# Patient Record
Sex: Female | Born: 1955 | Race: White | Hispanic: No | Marital: Married | Smoking: Never smoker
Health system: Southern US, Community
[De-identification: ages and names within clinical notes are randomized; demographics above are authoritative.]

## PROBLEM LIST (undated history)

## (undated) DIAGNOSIS — T7840XA Allergy, unspecified, initial encounter: Secondary | ICD-10-CM

## (undated) DIAGNOSIS — F32A Depression, unspecified: Secondary | ICD-10-CM

## (undated) DIAGNOSIS — F329 Major depressive disorder, single episode, unspecified: Secondary | ICD-10-CM

## (undated) DIAGNOSIS — E785 Hyperlipidemia, unspecified: Secondary | ICD-10-CM

## (undated) HISTORY — DX: Allergy, unspecified, initial encounter: T78.40XA

## (undated) HISTORY — DX: Hyperlipidemia, unspecified: E78.5

## (undated) HISTORY — DX: Depression, unspecified: F32.A

## (undated) HISTORY — PX: TOTAL ABDOMINAL HYSTERECTOMY: SHX209

## (undated) HISTORY — DX: Major depressive disorder, single episode, unspecified: F32.9

---

## 2004-03-09 DIAGNOSIS — M949 Disorder of cartilage, unspecified: Secondary | ICD-10-CM

## 2004-03-09 DIAGNOSIS — M899 Disorder of bone, unspecified: Secondary | ICD-10-CM | POA: Insufficient documentation

## 2006-11-19 ENCOUNTER — Encounter: Payer: Self-pay | Admitting: Family Medicine

## 2007-03-22 ENCOUNTER — Ambulatory Visit: Payer: Self-pay | Admitting: Family Medicine

## 2007-03-22 DIAGNOSIS — F339 Major depressive disorder, recurrent, unspecified: Secondary | ICD-10-CM

## 2007-03-22 DIAGNOSIS — G43709 Chronic migraine without aura, not intractable, without status migrainosus: Secondary | ICD-10-CM | POA: Insufficient documentation

## 2007-03-22 DIAGNOSIS — E785 Hyperlipidemia, unspecified: Secondary | ICD-10-CM

## 2007-03-28 ENCOUNTER — Encounter: Admission: RE | Admit: 2007-03-28 | Discharge: 2007-03-28 | Payer: Self-pay | Admitting: Family Medicine

## 2007-04-23 ENCOUNTER — Encounter: Payer: Self-pay | Admitting: Family Medicine

## 2007-04-23 DIAGNOSIS — D72819 Decreased white blood cell count, unspecified: Secondary | ICD-10-CM | POA: Insufficient documentation

## 2007-04-25 ENCOUNTER — Encounter: Payer: Self-pay | Admitting: Family Medicine

## 2007-05-24 ENCOUNTER — Encounter: Payer: Self-pay | Admitting: Family Medicine

## 2007-05-28 ENCOUNTER — Encounter: Admission: RE | Admit: 2007-05-28 | Discharge: 2007-05-28 | Payer: Self-pay | Admitting: Family Medicine

## 2007-05-28 ENCOUNTER — Ambulatory Visit: Payer: Self-pay | Admitting: Family Medicine

## 2007-05-28 DIAGNOSIS — M76899 Other specified enthesopathies of unspecified lower limb, excluding foot: Secondary | ICD-10-CM

## 2007-05-28 DIAGNOSIS — M5430 Sciatica, unspecified side: Secondary | ICD-10-CM | POA: Insufficient documentation

## 2007-05-28 LAB — CONVERTED CEMR LAB
LDL Cholesterol: 94 mg/dL (ref 0–99)
Triglycerides: 96 mg/dL (ref <150)
VLDL: 19 mg/dL (ref 0–40)

## 2008-01-27 ENCOUNTER — Telehealth: Payer: Self-pay | Admitting: Family Medicine

## 2008-06-02 ENCOUNTER — Telehealth: Payer: Self-pay | Admitting: Family Medicine

## 2008-06-05 ENCOUNTER — Ambulatory Visit: Payer: Self-pay | Admitting: Family Medicine

## 2008-06-05 DIAGNOSIS — M2550 Pain in unspecified joint: Secondary | ICD-10-CM | POA: Insufficient documentation

## 2008-06-05 DIAGNOSIS — J309 Allergic rhinitis, unspecified: Secondary | ICD-10-CM | POA: Insufficient documentation

## 2008-06-16 ENCOUNTER — Encounter: Admission: RE | Admit: 2008-06-16 | Discharge: 2008-06-16 | Payer: Self-pay | Admitting: Family Medicine

## 2008-06-17 ENCOUNTER — Encounter: Payer: Self-pay | Admitting: Family Medicine

## 2008-06-18 LAB — CONVERTED CEMR LAB
ALT: 18 units/L (ref 0–35)
Alkaline Phosphatase: 40 units/L (ref 39–117)
Anti Nuclear Antibody(ANA): NEGATIVE
Basophils Absolute: 0 10*3/uL (ref 0.0–0.1)
CO2: 20 meq/L (ref 19–32)
Hemoglobin: 14.7 g/dL (ref 12.0–15.0)
LDL Cholesterol: 120 mg/dL — ABNORMAL HIGH (ref 0–99)
Lymphocytes Relative: 44 % (ref 12–46)
Lymphs Abs: 1.9 10*3/uL (ref 0.7–4.0)
Monocytes Absolute: 0.4 10*3/uL (ref 0.1–1.0)
Neutro Abs: 1.9 10*3/uL (ref 1.7–7.7)
Potassium: 4.3 meq/L (ref 3.5–5.3)
RDW: 13.9 % (ref 11.5–15.5)
Sed Rate: 4 mm/hr (ref 0–22)
Sodium: 139 meq/L (ref 135–145)
Total Bilirubin: 0.5 mg/dL (ref 0.3–1.2)
Total Protein: 7 g/dL (ref 6.0–8.3)
VLDL: 26 mg/dL (ref 0–40)
WBC: 4.2 10*3/uL (ref 4.0–10.5)

## 2008-06-24 ENCOUNTER — Encounter: Payer: Self-pay | Admitting: Family Medicine

## 2009-02-15 ENCOUNTER — Telehealth: Payer: Self-pay | Admitting: Family Medicine

## 2009-06-10 ENCOUNTER — Telehealth: Payer: Self-pay | Admitting: Family Medicine

## 2009-07-06 ENCOUNTER — Telehealth: Payer: Self-pay | Admitting: Family Medicine

## 2009-07-16 ENCOUNTER — Ambulatory Visit: Payer: Self-pay | Admitting: Family Medicine

## 2009-08-13 ENCOUNTER — Ambulatory Visit: Payer: Self-pay | Admitting: Family Medicine

## 2009-08-16 LAB — CONVERTED CEMR LAB
ALT: 24 units/L (ref 0–35)
AST: 23 units/L (ref 0–37)
Alkaline Phosphatase: 46 units/L (ref 39–117)
LDL Cholesterol: 163 mg/dL — ABNORMAL HIGH (ref 0–99)
Sodium: 140 meq/L (ref 135–145)
Total Bilirubin: 0.7 mg/dL (ref 0.3–1.2)
Total Protein: 7.4 g/dL (ref 6.0–8.3)
Triglycerides: 137 mg/dL (ref <150)
VLDL: 27 mg/dL (ref 0–40)

## 2010-02-01 NOTE — Progress Notes (Signed)
Summary: Change Pravastatin dose/ no insurance/cheaper  Phone Note Call from Patient Call back at Home Phone [phone]   Summary of Call: No insurance so can not use [facility] anymore. Needs cholesterol med sent to [facility] in [place]. On Pravastatin 80mg  but it will be cheaper for her if she gets the Pravastatin 40mg  and take 2 a day Initial call taken by: Kathlene November,  February 15, 2009 1:24 PM    New/Updated Medications: PRAVASTATIN SODIUM 40 MG TABS (PRAVASTATIN SODIUM) 2 tabs by mouth qhs Prescriptions: PRAVASTATIN SODIUM 40 MG TABS (PRAVASTATIN SODIUM) 2 tabs by mouth qhs  #60 x 3   Entered and Authorized by:   Seymour Bars DO   Signed by:   Seymour Bars DO on 02/15/2009   Method used:   Electronically to        [facility] [phone]* (retail)       [address]       [place], [place]  [address]       Ph: [phone]       Fax: [phone]   RxID:   [id number]

## 2010-02-01 NOTE — Progress Notes (Signed)
Summary: refill pravastatin  Phone Note Refill Request Message from:  Fax from Pharmacy on July 06, 2009 4:17 PM  Refills Requested: Medication #1:  PRAVASTATIN SODIUM 40 MG TABS 2 tabs by mouth qhs   Last Refilled: [facility]10/2009 [facility] [phone]   Method Requested: Fax to Local Pharmacy Initial call taken by: Duard Brady LPN,  July 07, 863 4:17 PM  Follow-up for Phone Call        [facility] - [facility] - needs to be seen Follow-up by: Duard Brady LPN,  July 06, 7844 5:14 PM

## 2010-02-01 NOTE — Progress Notes (Signed)
Summary: No insurance needs med  Phone Note Call from Patient Call back at [facility] Phone [phone]   Caller: Patient Call For: Demetris Capell Summary of Call: Pt has no insurance and needs refills on the cholesterol med. [facility] [place]  Follow-up for Phone Call        She is due for labs. I will only fill her #1 month. Follow-up by: Seymour Bars DO,  June 10, 2009 10:28 AM    Prescriptions: PRAVASTATIN SODIUM 40 MG TABS (PRAVASTATIN SODIUM) 2 tabs by mouth qhs  #60 x 0   Entered and Authorized by:   Seymour Bars DO   Signed by:   Seymour Bars DO on 06/10/2009   Method used:   Electronically to        [facility] [phone]* (retail)       [address]       [place], [place]  [address]       Ph: [phone]       Fax: [phone]   RxID:   [id number]   Appended Document: No insurance needs med Pt notified of above instructions and med sent to pharmacy. KJ LPN

## 2010-02-01 NOTE — Assessment & Plan Note (Signed)
Summary: no charge; statin RFd for only 1 month   Vital Signs:  Patient profile:   55 year old female Height:      65.5 inches Weight:      174 pounds BMI:     28.62 O2 Sat:      99 % on Room air Pulse rate:   58 / minute BP sitting:   141 / 90  (left arm) Cuff size:   regular  Vitals Entered By: Payton Spark CMA (July 16, 2009 12:59 PM)  O2 Flow:  Room air CC: F/U. ? labs for med refills.   Primary Care Provider:  Nani Gasser MD  CC:  F/U. ? labs for med refills..  History of Present Illness: Pt was seen briefly today but refused to have labs done since she is self pay.  She is out of her Pravastatin and her Alendronate.    Current Medications (verified): 1)  Pravastatin Sodium 40 Mg Tabs (Pravastatin Sodium) .... 2 Tabs By Mouth Qhs 2)  Alendronate Sodium 70 Mg  Tabs (Alendronate Sodium) .... Once A Week By Mouth 3)  Glucosamine 1500 Complex   Caps (Glucosamine-Chondroit-Vit C-Mn) .... Take One- Two Tablets By Mouth Daily 4)  Calcium/vitamin D/minerals 600-200 Mg-Unit  Tabs (Calcium Carbonate-Vit D-Min) .... Take One Tabnlet By Mouth Once Adday 5)  Fish Oil 1200 Mg  Caps (Omega-3 Fatty Acids) .... Take One Tablet By Mouth Once A Day 6)  Potassium 95 Mg  Tbcr (Potassium Gluconate) .... Take One Tablet By Mouth Once Aday 7)  Magnesium Oxide 400 Mg  Tabs (Magnesium Oxide) .... Take 1 Tablet By Mouth Once A Day 8)  Fluoxetine Hcl 10 Mg  Tabs (Fluoxetine Hcl) .... Take 1 Tablet By Mouth Once A Day 9)  Fexofenadine Hcl 180 Mg Tabs (Fexofenadine Hcl) .... Take 1 Tablet By Mouth Once A Day 10)  Piroxicam 10 Mg Caps (Piroxicam) .... Take 1 Tablet By Mouth Two Times A Day As Needed  Allergies (verified): 1)  ! Lipitor 2)  Amitriptyline Hcl (Amitriptyline Hcl)   Impression & Recommendations:  Problem # 1:  HYPERLIPIDEMIA (ICD-272.4)  I RFd her meds for 1 month month only.  I will not charge her today as I have given her the # for the [facility] since she  reflused to pay for her labs which are overdue.   Her updated medication list for this problem includes:    Pravastatin Sodium 40 Mg Tabs (Pravastatin sodium) .Marland Kitchen... 2 tabs by mouth qhs  Orders: No Charge Patient Arrived (NCPA0) (NCPA0)  Complete Medication List: 1)  Pravastatin Sodium 40 Mg Tabs (Pravastatin sodium) .... 2 tabs by mouth qhs 2)  Alendronate Sodium 70 Mg Tabs (Alendronate sodium) .... Once a week by mouth 3)  Glucosamine 1500 Complex Caps (Glucosamine-chondroit-vit c-mn) .... Take one- two tablets by mouth daily 4)  Calcium/vitamin D/minerals 600-200 Mg-unit Tabs (Calcium carbonate-vit d-min) .... Take one tabnlet by mouth once adday 5)  Fish Oil 1200 Mg Caps (Omega-3 fatty acids) .... Take one tablet by mouth once a day 6)  Potassium 95 Mg Tbcr (Potassium gluconate) .... Take one tablet by mouth once aday 7)  Magnesium Oxide 400 Mg Tabs (Magnesium oxide) .... Take 1 tablet by mouth once a day 8)  Fluoxetine Hcl 10 Mg Tabs (Fluoxetine hcl) .... Take 1 tablet by mouth once a day 9)  Fexofenadine Hcl 180 Mg Tabs (Fexofenadine hcl) .... Take 1 tablet by mouth once a day 10)  Piroxicam 10 Mg  Caps (Piroxicam) .... Take 1 tablet by mouth two times a day as needed Prescriptions: ALENDRONATE SODIUM 70 MG  TABS (ALENDRONATE SODIUM) once a week by mouth  #12 x 0   Entered and Authorized by:   Seymour Bars DO   Signed by:   Seymour Bars DO on 07/16/2009   Method used:   Electronically to        [facility] [phone]* (retail)       [address]       [place], [place]  [address]       Ph: [phone]       Fax: [phone]   RxID:   [id number] PRAVASTATIN SODIUM 40 MG TABS (PRAVASTATIN SODIUM) 2 tabs by mouth qhs  #60 x 0   Entered and Authorized by:   Seymour Bars DO   Signed by:   Seymour Bars DO on 07/16/2009   Method used:   Electronically to        [facility] [phone]* (retail)       [address]       [place], [place]  [address]       Ph: [phone]        Fax: [phone]   RxID:   [id number]

## 2010-02-01 NOTE — Assessment & Plan Note (Signed)
Summary: f/u on meds   Vital Signs:  Patient profile:   55 year old female Height:      65.5 inches Weight:      175 pounds Pulse rate:   75 / minute BP sitting:   138 / 85  (left arm) Cuff size:   regular  Vitals Entered By: Avon Gully CMA, Duncan Dull) (August 13, 2009 8:43 AM) CC: f/u chol, needs labs   Primary Care Khamille Beynon:  Nani Gasser MD  CC:  f/u chol and needs labs.  History of Present Illness: f/u chol, needs labs.  No myalgias or SE of the medication.  Needs refills.  Has been working on losing weight.    Current Medications (verified): 1)  Pravastatin Sodium 40 Mg Tabs (Pravastatin Sodium) .... 2 Tabs By Mouth Qhs 2)  Alendronate Sodium 70 Mg  Tabs (Alendronate Sodium) .... Once A Week By Mouth 3)  Glucosamine 1500 Complex   Caps (Glucosamine-Chondroit-Vit C-Mn) .... Take One- Two Tablets By Mouth Daily 4)  Calcium/vitamin D/minerals 600-200 Mg-Unit  Tabs (Calcium Carbonate-Vit D-Min) .... Take One Tabnlet By Mouth Once Adday 5)  Fish Oil 1200 Mg  Caps (Omega-3 Fatty Acids) .... Take One Tablet By Mouth Once A Day 6)  Potassium 95 Mg  Tbcr (Potassium Gluconate) .... Take One Tablet By Mouth Once Aday 7)  Magnesium Oxide 400 Mg  Tabs (Magnesium Oxide) .... Take 1 Tablet By Mouth Once A Day 8)  Fluoxetine Hcl 10 Mg  Tabs (Fluoxetine Hcl) .... Take 1 Tablet By Mouth Once A Day 9)  Fexofenadine Hcl 180 Mg Tabs (Fexofenadine Hcl) .... Take 1 Tablet By Mouth Once A Day 10)  Piroxicam 10 Mg Caps (Piroxicam) .... Take 1 Tablet By Mouth Two Times A Day As Needed  Allergies (verified): 1)  ! Lipitor 2)  Amitriptyline Hcl (Amitriptyline Hcl)  Comments:  Nurse/Medical Assistant: The patient's medications and allergies were reviewed with the patient and were updated in the Medication and Allergy Lists. Avon Gully CMA, Duncan Dull) (August 13, 2009 8:43 AM)  Physical Exam  General:  Well-developed,well-nourished,in no acute distress; alert,appropriate and  cooperative throughout examination Head:  Normocephalic and atraumatic without obvious abnormalities. No apparent alopecia or balding. Neck:  No deformities, masses, or tenderness noted. Lungs:  Normal respiratory effort, chest expands symmetrically. Lungs are clear to auscultation, no crackles or wheezes. Heart:  Normal rate and regular rhythm. S1 and S2 normal without gallop, murmur, click, rub or other extra sounds. No carotid or abdominal bruits.  Abdomen:  Bowel sounds positive,abdomen soft and non-tender without masses, organomegaly or hernias noted. Skin:  no rashes.   Psych:  Cognition and judgment appear intact. Alert and cooperative with normal attention span and concentration. No apparent delusions, illusions, hallucinations   Impression & Recommendations:  Problem # 1:  HYPERLIPIDEMIA (ICD-272.4) Due to check lipids. Doing well.  Her updated medication list for this problem includes:    Pravastatin Sodium 40 Mg Tabs (Pravastatin sodium) .Marland Kitchen... 2 tabs by mouth qhs  Orders: T-Lipid Profile (24401-02725) T-Comprehensive Metabolic Panel ([id number])  Problem # 2:  DEPRESSION (ICD-311) Doing well on the medication. Happy with current dose. Let her know can incrase if needed,just to call.  Her updated medication list for this problem includes:    Fluoxetine Hcl 10 Mg Tabs (Fluoxetine hcl) .Marland Kitchen... Take 1 tablet by mouth once a day  Complete Medication List: 1)  Pravastatin Sodium 40 Mg Tabs (Pravastatin sodium) .... 2 tabs by mouth qhs 2)  Alendronate Sodium 70 Mg  Tabs (Alendronate sodium) .... Once a week by mouth 3)  Glucosamine 1500 Complex Caps (Glucosamine-chondroit-vit c-mn) .... Take one- two tablets by mouth daily 4)  Calcium/vitamin D/minerals 600-200 Mg-unit Tabs (Calcium carbonate-vit d-min) .... Take one tabnlet by mouth once adday 5)  Fish Oil 1200 Mg Caps (Omega-3 fatty acids) .... Take one tablet by mouth once a day 6)  Potassium 95 Mg Tbcr (Potassium gluconate) ....  Take one tablet by mouth once aday 7)  Magnesium Oxide 400 Mg Tabs (Magnesium oxide) .... Take 1 tablet by mouth once a day 8)  Fluoxetine Hcl 10 Mg Tabs (Fluoxetine hcl) .... Take 1 tablet by mouth once a day 9)  Fexofenadine Hcl 180 Mg Tabs (Fexofenadine hcl) .... Take 1 tablet by mouth once a day 10)  Piroxicam 10 Mg Caps (Piroxicam) .... Take 1 tablet by mouth two times a day as needed  Patient Instructions: 1)  We will call you with your lab results next week.  Prescriptions: PIROXICAM 10 MG CAPS (PIROXICAM) Take 1 tablet by mouth two times a day as needed  #60 x 4   Entered and Authorized by:   Nani Gasser MD   Signed by:   Nani Gasser MD on 08/13/2009   Method used:   Electronically to        [facility] [phone]* (retail)       [address]       [place], [place]  [address]       Ph: [phone]       Fax: [phone]   RxID:   [id number] FLUOXETINE HCL 10 MG  TABS (FLUOXETINE HCL) Take 1 tablet by mouth once a day  #90 x 4   Entered and Authorized by:   Nani Gasser MD   Signed by:   Nani Gasser MD on 08/13/2009   Method used:   Electronically to        [facility] [phone]* (retail)       [address]       [place], [place]  [address]       Ph: [phone]       Fax: [phone]   RxID:   [id number] ALENDRONATE SODIUM 70 MG  TABS (ALENDRONATE SODIUM) once a week by mouth  #12 x 3   Entered and Authorized by:   Nani Gasser MD   Signed by:   Nani Gasser MD on 08/13/2009   Method used:   Electronically to        [facility] [phone]* (retail)       [address]       [place], [place]  [address]       Ph: [phone]       Fax: [phone]   RxID:   [id number] PRAVASTATIN SODIUM 40 MG TABS (PRAVASTATIN SODIUM) 2 tabs by mouth qhs  #60 x 11   Entered and Authorized by:   Nani Gasser MD   Signed by:   Nani Gasser MD on 08/13/2009   Method used:   Electronically to        [facility] [phone]* (retail)       [address]       [place], [place]  [address]       Ph: [phone]       Fax: [phone]   RxID:   [id number]

## 2010-05-19 ENCOUNTER — Other Ambulatory Visit: Payer: Self-pay | Admitting: Family Medicine

## 2010-08-18 ENCOUNTER — Other Ambulatory Visit: Payer: Self-pay | Admitting: *Deleted

## 2010-08-24 ENCOUNTER — Other Ambulatory Visit: Payer: Self-pay | Admitting: *Deleted

## 2010-08-26 ENCOUNTER — Ambulatory Visit (INDEPENDENT_AMBULATORY_CARE_PROVIDER_SITE_OTHER): Payer: Self-pay | Admitting: Family Medicine

## 2010-08-26 VITALS — BP 134/88 | HR 75 | Ht 65.5 in | Wt 162.0 lb

## 2010-08-26 DIAGNOSIS — E785 Hyperlipidemia, unspecified: Secondary | ICD-10-CM

## 2010-08-26 DIAGNOSIS — F329 Major depressive disorder, single episode, unspecified: Secondary | ICD-10-CM

## 2010-08-26 DIAGNOSIS — G43709 Chronic migraine without aura, not intractable, without status migrainosus: Secondary | ICD-10-CM

## 2010-08-26 MED ORDER — PRAVASTATIN SODIUM 40 MG PO TABS: 40.0000 mg | ORAL_TABLET | Freq: Two times a day (BID) | ORAL | Status: DC

## 2010-08-26 MED ORDER — FLUOXETINE HCL 10 MG PO CAPS: 10.0000 mg | ORAL_CAPSULE | Freq: Every day | ORAL | Status: DC

## 2010-08-26 MED ORDER — ALENDRONATE SODIUM 70 MG PO TABS: 70.0000 mg | ORAL_TABLET | ORAL | Status: DC

## 2010-08-26 MED ORDER — PIROXICAM 10 MG PO CAPS: 10.0000 mg | ORAL_CAPSULE | Freq: Two times a day (BID) | ORAL | Status: DC | PRN

## 2010-08-26 NOTE — Assessment & Plan Note (Signed)
She was given a lab slip today to go Monday to check her CMP and lipids to make sure she is still well controlled. I did send her for refills for one year. Recheck in one year if she is at goal.

## 2010-08-26 NOTE — Assessment & Plan Note (Signed)
Currently she is doing well with her over-the-counter regimen. Followup 1 year.

## 2010-08-26 NOTE — Assessment & Plan Note (Signed)
She is doing well and is not interested in coming off of her medication. I refilled for one year. Followup 1 year.

## 2010-08-26 NOTE — Progress Notes (Signed)
  Subjective:    Patient ID: Denise Rojas, female    DOB: 10/09/1955, 55 y.o.   MRN: [id number]  HPI  Mood - fluoxetine and is oing well. Wants to stay on her current regimen.  Mood is ok.  Sleeping well.  Occ taking Advil PM to help her sleep.    Cholesterol pill - tolerating. No SE.  no myalgias. Does every year since we checked her cholesterol level.  Migraine - Still occ gets migrine. Says the last med I gave her zonked her out for 2 days.  She just uses an OTC med that is like excedrin migraine.  Son moved out and her stress level when down.   Review of Systems     Objective:   Physical Exam  Constitutional: She is oriented to person, place, and time. She appears well-developed and well-nourished.  HENT:  Head: Normocephalic and atraumatic.  Cardiovascular: Normal rate, regular rhythm and normal heart sounds.   Pulmonary/Chest: Effort normal and breath sounds normal.  Musculoskeletal: She exhibits no edema.  Neurological: She is alert and oriented to person, place, and time.  Skin: Skin is warm and dry.  Psychiatric: She has a normal mood and affect. Her behavior is normal.          Assessment & Plan:  Note, she does not have insurance right now. And feels very limited with what she is able to do. She declined her mammogram and colonoscopy secondary to expense. She is hoping that she might be able to get help insurance in the next year.

## 2010-08-29 LAB — LIPID PANEL
LDL Cholesterol: 92 mg/dL (ref 0–99)
Triglycerides: 158 mg/dL — ABNORMAL HIGH (ref <150)
VLDL: 32 mg/dL (ref 0–40)

## 2010-08-30 ENCOUNTER — Telehealth: Payer: Self-pay | Admitting: Family Medicine

## 2010-08-30 LAB — COMPLETE METABOLIC PANEL WITH GFR
ALT: 16 U/L (ref 0–35)
AST: 19 U/L (ref 0–37)
Calcium: 8.9 mg/dL (ref 8.4–10.5)
Chloride: 109 mEq/L (ref 96–112)
Creat: 0.76 mg/dL (ref 0.50–1.10)
Potassium: 4 mEq/L (ref 3.5–5.3)

## 2010-08-30 NOTE — Telephone Encounter (Signed)
Call patient: Complete metabolic panel is normal. Cholesterol looks good except for triglycerides are just a little bit elevated. Make sure eating a healthy low-fat diet as well as getting some regular exercise and this should resolve the high triglyceride issue. Recheck in one year.

## 2010-08-31 NOTE — Telephone Encounter (Signed)
Pt informed of her recent lab values.  Instructions given and pt voiced understanding. Jarvis Newcomer, LPN [facility] Dimes

## 2011-02-26 ENCOUNTER — Other Ambulatory Visit: Payer: Self-pay | Admitting: Family Medicine

## 2011-03-02 ENCOUNTER — Encounter: Payer: Self-pay | Admitting: *Deleted

## 2011-03-09 ENCOUNTER — Ambulatory Visit (INDEPENDENT_AMBULATORY_CARE_PROVIDER_SITE_OTHER): Payer: PRIVATE HEALTH INSURANCE | Admitting: Family Medicine

## 2011-03-09 ENCOUNTER — Encounter: Payer: Self-pay | Admitting: Family Medicine

## 2011-03-09 VITALS — BP 177/84 | HR 55 | Ht 65.5 in | Wt 167.0 lb

## 2011-03-09 DIAGNOSIS — Z1231 Encounter for screening mammogram for malignant neoplasm of breast: Secondary | ICD-10-CM

## 2011-03-09 DIAGNOSIS — G2581 Restless legs syndrome: Secondary | ICD-10-CM

## 2011-03-09 DIAGNOSIS — Z0000 Encounter for general adult medical examination without abnormal findings: Secondary | ICD-10-CM

## 2011-03-09 DIAGNOSIS — Z1211 Encounter for screening for malignant neoplasm of colon: Secondary | ICD-10-CM

## 2011-03-09 DIAGNOSIS — Z23 Encounter for immunization: Secondary | ICD-10-CM

## 2011-03-09 MED ORDER — PRAVASTATIN SODIUM 40 MG PO TABS: 80.0000 mg | ORAL_TABLET | Freq: Every day | ORAL | Status: DC

## 2011-03-09 MED ORDER — PIROXICAM 10 MG PO CAPS: 10.0000 mg | ORAL_CAPSULE | Freq: Two times a day (BID) | ORAL | Status: DC | PRN

## 2011-03-09 MED ORDER — ALENDRONATE SODIUM 70 MG PO TABS: 70.0000 mg | ORAL_TABLET | ORAL | Status: DC

## 2011-03-09 NOTE — Progress Notes (Signed)
  Subjective:     Denise Rojas is a 56 y.o. female and is here for a comprehensive physical exam. The patient reports no problems. She does have some moles she would like to look in on her skin today.  History   Social History  . Marital Status: Married    Spouse Name: Fayrene Fearing    Number of Children: N/A  . Years of Education: N/A   Occupational History  . Not on file.   Social History Main Topics  . Smoking status: Never Smoker   . Smokeless tobacco: Not on file  . Alcohol Use: Yes  . Drug Use: No  . Sexually Active: Yes   Other Topics Concern  . Not on file   Social History Narrative   Some regular exercise. 1 adopted children.    Health Maintenance  Topic Date Due  . Tetanus/tdap  11/02/2008  . Mammogram  06/17/2010  . Influenza Vaccine  03/26/2011  . Colonoscopy  08/25/2020    The following portions of the patient's history were reviewed and updated as appropriate: allergies, current medications, past family history, past medical history, past social history, past surgical history and problem list.  Review of Systems A comprehensive review of systems was negative.   Objective:    BP 177/84  Pulse 55  Ht 5' 5.5" (1.664 m)  Wt 167 lb (75.751 kg)  BMI 27.37 kg/m2 General appearance: alert, cooperative and appears stated age Head: Normocephalic, without obvious abnormality, atraumatic Eyes: conj clear, EOMi, PEERLA Ears: normal TM's and external ear canals both ears Nose: Nares normal. Septum midline. Mucosa normal. No drainage or sinus tenderness. Throat: lips, mucosa, and tongue normal; teeth and gums normal Neck: no adenopathy, no carotid bruit, no JVD, supple, symmetrical, trachea midline and thyroid not enlarged, symmetric, no tenderness/mass/nodules Back: symmetric, no curvature. ROM normal. No CVA tenderness. Lungs: clear to auscultation bilaterally Breasts: normal appearance, no masses or tenderness Heart: regular rate and rhythm, S1, S2 normal, no  murmur, click, rub or gallop Abdomen: soft, non-tender; bowel sounds normal; no masses,  no organomegaly Extremities: extremities normal, atraumatic, no cyanosis or edema Pulses: 2+ and symmetric Skin: Skin color, texture, turgor normal. No rashes or lesions Lymph nodes: Cervical, supraclavicular, and axillary nodes normal. Neurologic: Alert and oriented X 3, normal strength and tone. Normal symmetric reflexes. Normal coordination and gait   She does have some cherry angiomas on her back as well as some seborrheic keratoses. Chest has a very small sebaceous cyst on her right upper abdomen over the rib cage. I gave her reassurance I explained what these were.   Assessment:    Healthy female exam.      Plan:     See After Visit Summary for Counseling Recommendations  Start a regular exercise program and make sure you are eating a healthy diet Try to eat 4 servings of dairy a day or take a calcium supplement (500mg  twice a day). Updated her med list.  Your vaccines are up to date.  Due for screening labs. We will call with results. She is overdue for screening colonoscopy. I will refer to gastroenterology. Due for screening mammogram. Will refer. She finally got insurance again.

## 2011-03-09 NOTE — Patient Instructions (Signed)
Start a regular exercise program and make sure you are eating a healthy diet Try to eat 4 servings of dairy a day or take a calcium supplement (500mg twice a day). Your vaccines are up to date.   

## 2011-03-14 ENCOUNTER — Other Ambulatory Visit: Payer: Self-pay | Admitting: Family Medicine

## 2011-03-14 LAB — COMPLETE METABOLIC PANEL WITH GFR
ALT: 16 U/L (ref 0–35)
AST: 22 U/L (ref 0–37)
Alkaline Phosphatase: 38 U/L — ABNORMAL LOW (ref 39–117)
Calcium: 9.3 mg/dL (ref 8.4–10.5)
Chloride: 106 mEq/L (ref 96–112)
Creat: 0.77 mg/dL (ref 0.50–1.10)
Total Bilirubin: 0.5 mg/dL (ref 0.3–1.2)

## 2011-03-14 LAB — LIPID PANEL
HDL: 43 mg/dL (ref >39)
LDL Cholesterol: 128 mg/dL — ABNORMAL HIGH (ref 0–99)
Total CHOL/HDL Ratio: 4.7 Ratio
Triglycerides: 161 mg/dL — ABNORMAL HIGH (ref <150)
VLDL: 32 mg/dL (ref 0–40)

## 2011-03-14 MED ORDER — ATORVASTATIN CALCIUM 40 MG PO TABS: 40.0000 mg | ORAL_TABLET | Freq: Every day | ORAL | Status: DC

## 2011-03-28 ENCOUNTER — Ambulatory Visit
Admission: RE | Admit: 2011-03-28 | Discharge: 2011-03-28 | Disposition: A | Discharge status: Home / Self Care | Payer: PRIVATE HEALTH INSURANCE | Source: Ambulatory Visit | Attending: Family Medicine | Admitting: Family Medicine

## 2011-03-28 DIAGNOSIS — Z1231 Encounter for screening mammogram for malignant neoplasm of breast: Secondary | ICD-10-CM

## 2011-05-18 ENCOUNTER — Ambulatory Visit (INDEPENDENT_AMBULATORY_CARE_PROVIDER_SITE_OTHER): Payer: PRIVATE HEALTH INSURANCE | Admitting: Family Medicine

## 2011-05-18 ENCOUNTER — Other Ambulatory Visit: Payer: Self-pay | Admitting: Family Medicine

## 2011-05-18 ENCOUNTER — Encounter: Payer: Self-pay | Admitting: Family Medicine

## 2011-05-18 VITALS — BP 152/85 | HR 57 | Temp 98.2°F | Ht 65.5 in | Wt 170.0 lb

## 2011-05-18 DIAGNOSIS — N390 Urinary tract infection, site not specified: Secondary | ICD-10-CM

## 2011-05-18 DIAGNOSIS — N760 Acute vaginitis: Secondary | ICD-10-CM

## 2011-05-18 DIAGNOSIS — J309 Allergic rhinitis, unspecified: Secondary | ICD-10-CM

## 2011-05-18 DIAGNOSIS — I10 Essential (primary) hypertension: Secondary | ICD-10-CM | POA: Insufficient documentation

## 2011-05-18 DIAGNOSIS — R300 Dysuria: Secondary | ICD-10-CM

## 2011-05-18 LAB — POCT URINALYSIS DIPSTICK
Ketones, UA: NEGATIVE
Protein, UA: NEGATIVE
Spec Grav, UA: 1.01
pH, UA: 7

## 2011-05-18 MED ORDER — FLUTICASONE PROPIONATE 50 MCG/ACT NA SUSP: 2.0000 | Freq: Every day | NASAL | Status: DC

## 2011-05-18 MED ORDER — SULFAMETHOXAZOLE-TRIMETHOPRIM 800-160 MG PO TABS: 1.0000 | ORAL_TABLET | Freq: Two times a day (BID) | ORAL | Status: AC

## 2011-05-18 MED ORDER — LISINOPRIL 20 MG PO TABS: 20.0000 mg | ORAL_TABLET | Freq: Every day | ORAL | Status: DC

## 2011-05-18 NOTE — Progress Notes (Signed)
Addended by: Wyline Beady on: 05/18/2011 02:22 PM   Modules accepted: Orders

## 2011-05-18 NOTE — Progress Notes (Signed)
  Subjective:    Patient ID: Denise Rojas, female    DOB: 07-16-1955, 57 y.o.   MRN: [id number]  HPI UTI- vaginal itching, freq urinary urgency, burning while urinating. No blood in the urine + odor d/c.  Some low back pain but thinks from gardening. She takes prioxicam daily for OA.  Says Tyelnol doesn't work for her.   She is concerned about her weight.  She may be interested in nutrition cousneling. Does some stretches. No cardio. She says when she tries to make heself eat health she wants to eat more.   AR- ears ithcing and throat.  Mild swollen LN.  Taking generic Allegra. Some  Mild nasal congestion.  No fever.  No GI sxs.  Hx of spring allergies.     Review of Systems     Objective:   Physical Exam  Constitutional: She is oriented to person, place, and time. She appears well-developed and well-nourished.  HENT:  Head: Normocephalic and atraumatic.  Right Ear: External ear normal.  Left Ear: External ear normal.  Nose: Nose normal.  Mouth/Throat: Oropharynx is clear and moist.       TMs and canals are clear.   Eyes: Conjunctivae and EOM are normal. Pupils are equal, round, and reactive to light.  Neck: Neck supple. No thyromegaly present.  Cardiovascular: Normal rate, regular rhythm and normal heart sounds.   Pulmonary/Chest: Effort normal and breath sounds normal. She has no wheezes.  Lymphadenopathy:    She has no cervical adenopathy.  Neurological: She is alert and oriented to person, place, and time.  Skin: Skin is warm and dry.  Psychiatric: She has a normal mood and affect.          Assessment & Plan:  UTI - Will tx with Bactrim D bid x 3 days.    Vaginitis-she does perform self wet prep today. We will call her with the results once we get it back.  AR- Disscussed adding a nasal steroid spray to her antihistamine. Call if not better in one week. Reviewed how to use/adminster nasal spray.   Overweight - Discussed working on exercise for 30 min 5 days per  weeks.  Discussed nutrition referral.  Pt is ok wwith this.  She dosen't seem very motivated right now. Says her husband and dogs have gained weight too.    Colon CA - We made refer in March but was never called back.  Will call today to see if they can schedule her. Regarding her preop appointment for June 6. She was given the above information worse.  Hypertension-new diagnosis. She says she's been working for almost 3 years but her blood pressures only been high in the last 6 months. We discussed starting a blood pressure medication. We will start lisinopril 20 mg daily. Discussed potential side effects. Patient to followup in 3 weeks. I did ask her to stop her camper at least 3 days to see if her blood pressure comes down. She does check her blood pressure at home it has been running high at home as well. If the blood pressure does come down off the medication and she can call the office and we can change her to tramadol for pain control if it does not come down then she can start a blood pressure pill and restart her piroxicam.

## 2011-05-18 NOTE — Patient Instructions (Addendum)
1.5 Gram Low Sodium Diet A 1.5 gram sodium diet restricts the amount of sodium in the diet to no more than 1.5 g or 1500 mg daily. The American Heart Association recommends Americans over the age of 20 to consume no more than 1500 mg of sodium each day to reduce the risk of developing high blood pressure. Research also shows that limiting sodium may reduce heart attack and stroke risk. Many foods contain sodium for flavor and sometimes as a preservative. When the amount of sodium in a diet needs to be low, it is important to know what to look for when choosing foods and drinks. The following includes some information and guidelines to help make it easier for you to adapt to a low sodium diet. QUICK TIPS  Do not add salt to food.   Avoid convenience items and fast food.   Choose unsalted snack foods.   Buy lower sodium products, often labeled as "lower sodium" or "no salt added."   Check food labels to learn how much sodium is in 1 serving.   When eating at a restaurant, ask that your food be prepared with less salt or none, if possible.  READING FOOD LABELS FOR SODIUM INFORMATION The nutrition facts label is a good place to find how much sodium is in foods. Look for products with no more than 400 mg of sodium per serving. Remember that 1.5 g = 1500 mg. The food label may also list foods as:  Sodium-free: Less than 5 mg in a serving.   Very low sodium: 35 mg or less in a serving.   Low-sodium: 140 mg or less in a serving.   Light in sodium: 50% less sodium in a serving. For example, if a food that usually has 300 mg of sodium is changed to become light in sodium, it will have 150 mg of sodium.   Reduced sodium: 25% less sodium in a serving. For example, if a food that usually has 400 mg of sodium is changed to reduced sodium, it will have 300 mg of sodium.  CHOOSING FOODS Grains  Avoid: Salted crackers and snack items. Some cereals, including instant hot cereals. Bread stuffing and  biscuit mixes. Seasoned rice or pasta mixes.   Choose: Unsalted snack items. Low-sodium cereals, oats, puffed wheat and rice, shredded wheat. English muffins and bread. Pasta.  Meats  Avoid: Salted, canned, smoked, spiced, pickled meats, including fish and poultry. Bacon, ham, sausage, cold cuts, hot dogs, anchovies.   Choose: Low-sodium canned tuna and salmon. Fresh or frozen meat, poultry, and fish.  Dairy  Avoid: Processed cheese and spreads. Cottage cheese. Buttermilk and condensed milk. Regular cheese.   Choose: Milk. Low-sodium cottage cheese. Yogurt. Sour cream. Low-sodium cheese.  Fruits and Vegetables  Avoid: Regular canned vegetables. Regular canned tomato sauce and paste. Frozen vegetables in sauces. Olives. Pickles. Relishes. Sauerkraut.   Choose: Low-sodium canned vegetables. Low-sodium tomato sauce and paste. Frozen or fresh vegetables. Fresh and frozen fruit.  Condiments  Avoid: Canned and packaged gravies. Worcestershire sauce. Tartar sauce. Barbecue sauce. Soy sauce. Steak sauce. Ketchup. Onion, garlic, and table salt. Meat flavorings and tenderizers.   Choose: Fresh and dried herbs and spices. Low-sodium varieties of mustard and ketchup. Lemon juice. Tabasco sauce. Horseradish.  SAMPLE 1.5 GRAM SODIUM MEAL PLAN Breakfast / Sodium (mg)  1 cup low-fat milk / 143 mg   1 whole-wheat English muffin / 240 mg   1 tbs heart-healthy margarine / 153 mg   1 hard-boiled egg /   139 mg   1 small orange / 0 mg  Lunch / Sodium (mg)  1 cup raw carrots / 76 mg   2 tbs no salt added peanut butter / 5 mg   2 slices whole-wheat bread / 270 mg   1 tbs jelly / 6 mg    cup red grapes / 2 mg  Dinner / Sodium (mg)  1 cup whole-wheat pasta / 2 mg   1 cup low-sodium tomato sauce / 73 mg   3 oz lean ground beef / 57 mg   1 small side salad (1 cup raw spinach leaves,  cup cucumber,  cup yellow bell pepper) with 1 tsp olive oil and 1 tsp red wine vinegar / 25 mg  Snack /  Sodium (mg)  1 container low-fat vanilla yogurt / 107 mg   3 graham cracker squares / 127 mg  Nutrient Analysis  Calories: 1745   Protein: 75 g   Carbohydrate: 237 g   Fat: 57 g   Sodium: 1425 mg  Document Released: 12/19/2004 Document Revised: 12/08/2010 Document Reviewed: 03/22/2009 [facility] Patient Information 2012 [place], [place].Urinary Tract Infection Infections of the urinary tract can start in several places. A bladder infection (cystitis), a kidney infection (pyelonephritis), and a prostate infection (prostatitis) are different types of urinary tract infections (UTIs). They usually get better if treated with medicines (antibiotics) that kill germs. Take all the medicine until it is gone. You or your child may feel better in a few days, but TAKE ALL MEDICINE or the infection may not respond and may become more difficult to treat. HOME CARE INSTRUCTIONS    Drink enough water and fluids to keep the urine clear or pale yellow. Cranberry juice is especially recommended, in addition to large amounts of water.   Avoid caffeine, tea, and carbonated beverages. They tend to irritate the bladder.   Alcohol may irritate the prostate.   Only take over-the-counter or prescription medicines for pain, discomfort, or fever as directed by your caregiver.  To prevent further infections:  Empty the bladder often. Avoid holding urine for long periods of time.   After a bowel movement, women should cleanse from front to back. Use each tissue only once.   Empty the bladder before and after sexual intercourse.  FINDING OUT THE RESULTS OF YOUR TEST Not all test results are available during your visit. If your or your child's test results are not back during the visit, make an appointment with your caregiver to find out the results. Do not assume everything is normal if you have not heard from your caregiver or the medical facility. It is important for you to follow up on all test results. SEEK  MEDICAL CARE IF:    There is back pain.   Your baby is older than 3 months with a rectal temperature of 100.5 F (38.1 C) or higher for more than 1 day.   Your or your child's problems (symptoms) are no better in 3 days. Return sooner if you or your child is getting worse.  SEEK IMMEDIATE MEDICAL CARE IF:    There is severe back pain or lower abdominal pain.   You or your child develops chills.   You have a fever.   Your baby is older than 3 months with a rectal temperature of 102 F (38.9 C) or higher.   Your baby is 40 months old or younger with a rectal temperature of 100.4 F (38 C) or higher.   There is nausea  or vomiting.   There is continued burning or discomfort with urination.  MAKE SURE YOU:    Understand these instructions.   Will watch your condition.   Will get help right away if you are not doing well or get worse.  Document Released: 09/28/2004 Document Revised: 12/08/2010 Document Reviewed: 05/03/2006 [facility] Patient Information 2012 [place], [place].

## 2011-05-19 LAB — WET PREP, GENITAL: Trich, Wet Prep: NONE SEEN

## 2011-05-21 ENCOUNTER — Other Ambulatory Visit: Payer: Self-pay | Admitting: Family Medicine

## 2011-05-21 MED ORDER — METRONIDAZOLE 500 MG PO TABS: 500.0000 mg | ORAL_TABLET | Freq: Two times a day (BID) | ORAL | Status: AC

## 2011-05-26 ENCOUNTER — Telehealth: Payer: Self-pay | Admitting: *Deleted

## 2011-05-26 MED ORDER — NABUMETONE 500 MG PO TABS: 500.0000 mg | ORAL_TABLET | Freq: Two times a day (BID) | ORAL | Status: DC

## 2011-05-26 NOTE — Telephone Encounter (Signed)
We'll try changing her to Relafen. This would can technically still raise blood pressure too,  but would like for her to try it and let me know.

## 2011-05-26 NOTE — Telephone Encounter (Signed)
Pt states that her BPs are running 120's/70's since she seen you. She states that she needs an rx sent over to replace the arthritis med you d/c'd. Please advise.

## 2011-05-26 NOTE — Telephone Encounter (Signed)
Pt informed

## 2011-06-08 ENCOUNTER — Ambulatory Visit (INDEPENDENT_AMBULATORY_CARE_PROVIDER_SITE_OTHER): Payer: PRIVATE HEALTH INSURANCE | Admitting: Family Medicine

## 2011-06-08 ENCOUNTER — Encounter: Payer: Self-pay | Admitting: Family Medicine

## 2011-06-08 VITALS — BP 142/78 | HR 88 | Ht 65.5 in | Wt 167.0 lb

## 2011-06-08 DIAGNOSIS — N760 Acute vaginitis: Secondary | ICD-10-CM

## 2011-06-08 DIAGNOSIS — N390 Urinary tract infection, site not specified: Secondary | ICD-10-CM

## 2011-06-08 DIAGNOSIS — R319 Hematuria, unspecified: Secondary | ICD-10-CM

## 2011-06-08 DIAGNOSIS — I10 Essential (primary) hypertension: Secondary | ICD-10-CM

## 2011-06-08 LAB — POCT URINALYSIS DIPSTICK
Ketones, UA: NEGATIVE
Protein, UA: NEGATIVE
Spec Grav, UA: 1.01
Urobilinogen, UA: 0.2

## 2011-06-08 NOTE — Progress Notes (Signed)
  Subjective:    Patient ID: Denise Rojas, female    DOB: [birth year], 56 y.o.   MRN: [id number]  HPI HTN f/u she didn't start the BP pill. Has been eating low salt diet.  Working in the yard for exercise.  She has not CP or SOB or palpitations.    Vaginitis-reviewed with her that her results did show BV. She is percent better after treatment.  UTI-she is 100% better after treatment.   Review of Systems     Objective:   Physical Exam  Constitutional: She is oriented to person, place, and time. She appears well-developed and well-nourished.  HENT:  Head: Normocephalic and atraumatic.  Cardiovascular: Normal rate, regular rhythm and normal heart sounds.   Pulmonary/Chest: Effort normal and breath sounds normal.  Neurological: She is alert and oriented to person, place, and time.  Skin: Skin is warm and dry.  Psychiatric: She has a normal mood and affect. Her behavior is normal.          Assessment & Plan:  HTN - Encoraged her to start pill. F/U in 4-6 weeks. Check BMP at that time. Continue work on low-salt diet and regular exercise and weight loss.  Hematuria - Repeat UA today. She was treated for UTI when I saw her last time. She had negative leukocytes and nitrites but she was symptomatic and had hematuria so I treated her. She is 100% better today but I did want to repeat her urine to make sure that the hematuria resolved.Repeat UA had trace blood.  Send a culture to confirm UTI is clear and is not causing it and send for mico.

## 2011-06-09 LAB — URINALYSIS, MICROSCOPIC ONLY
Crystals: NONE SEEN
Squamous Epithelial / LPF: NONE SEEN

## 2011-06-10 LAB — URINE CULTURE: Colony Count: 55000

## 2011-07-14 ENCOUNTER — Encounter: Payer: Self-pay | Admitting: *Deleted

## 2011-07-21 ENCOUNTER — Ambulatory Visit (INDEPENDENT_AMBULATORY_CARE_PROVIDER_SITE_OTHER): Payer: PRIVATE HEALTH INSURANCE | Admitting: Physician Assistant

## 2011-07-21 ENCOUNTER — Encounter: Payer: Self-pay | Admitting: Physician Assistant

## 2011-07-21 ENCOUNTER — Encounter: Payer: Self-pay | Admitting: Family Medicine

## 2011-07-21 VITALS — BP 139/86 | HR 62 | Wt 168.0 lb

## 2011-07-21 DIAGNOSIS — R51 Headache: Secondary | ICD-10-CM

## 2011-07-21 DIAGNOSIS — R319 Hematuria, unspecified: Secondary | ICD-10-CM

## 2011-07-21 LAB — CBC WITH DIFFERENTIAL/PLATELET
Basophils Relative: 1 % (ref 0–1)
Hemoglobin: 15.1 g/dL — ABNORMAL HIGH (ref 12.0–15.0)
Lymphs Abs: 1.1 10*3/uL (ref 0.7–4.0)
MCHC: 35.1 g/dL (ref 30.0–36.0)
Monocytes Relative: 9 % (ref 3–12)
Neutro Abs: 2.8 10*3/uL (ref 1.7–7.7)
Neutrophils Relative %: 63 % (ref 43–77)
RBC: 4.87 MIL/uL (ref 3.87–5.11)

## 2011-07-21 LAB — POCT URINALYSIS DIPSTICK
Glucose, UA: NEGATIVE
Ketones, UA: NEGATIVE
Spec Grav, UA: 1.02

## 2011-07-21 LAB — SEDIMENTATION RATE: Sed Rate: 4 mm/hr (ref 0–22)

## 2011-07-21 NOTE — Patient Instructions (Addendum)
Will send urine for culture. Will get labs to further evaluate headache. Will call with results on Monday.   Stay hydrated. Follow up if labs are normal for evaluation of headache and we will change headache medication.   AZO over the counter for urinary symptoms.

## 2011-07-21 NOTE — Progress Notes (Signed)
  Subjective:    Patient ID: Denise Rojas, female    DOB: 05-21-1955, 56 y.o.   MRN: [id number]  HPI Patient presents to the clinic with 3 days of visible blood in urine. She is asymptomatic with any urinary symptoms. She denies fever, chills, or abdominal discomfort. Her last UTI was in June of this year and she was given Bactrim and is fully cleared.  Patient has a history of chronic migraine. She has not had any problems of migraines in the past year or so. Recently for the last 3 days she has started to have headaches that do not present like her migraines. They are only located to the right temporalis and of head and very dull and constant. She has been taking Excedrin Migraine and it does help some. She has Relafen for her migraines but she does not take it because it makes her too sleepy.   Review of Systems     Objective:   Physical Exam  Constitutional: She is oriented to person, place, and time. She appears well-developed and well-nourished.       Overweight.  HENT:  Head: Normocephalic and atraumatic.       Tenderness to palpation over the temporal region of the right side.  Cardiovascular: Normal rate and normal heart sounds.   Pulmonary/Chest: Effort normal and breath sounds normal. She has no wheezes.       No CVA tenderness.  Abdominal: Soft. Bowel sounds are normal. She exhibits no distension and no mass. There is no tenderness. There is no guarding.  Neurological: She is alert and oriented to person, place, and time.  Skin: Skin is warm and dry.  Psychiatric: She has a normal mood and affect. Her behavior is normal.          Assessment & Plan:  Hematuria-UA was positive for blood but negative for leuks, and nitrates. We'll send for culture and call patient with results. Since patient is asymptomatic I do not want to treat with another antibiotic. If no bacterial cause of blood in urine we will recheck urine in 2 weeks and if remains positive for blood thin-walled  sent to urology.patient was told she could use if the over-the-counter if she didn't develop any urinary symptoms and discomfort.  Right-sided temporal headache-since headache location and pain has changed I would like to do a workup for temp oral arteritis. Ordered ESR, CRP, and CBC today. If any of these come back positive we'll consider a biopsy of the temporal region. Patient has not been taking medication for chronic migraines because she says that it makes her too sleepy. If labs are normal we will consider changing her medication for migraines.

## 2011-07-22 LAB — C-REACTIVE PROTEIN: CRP: <0.5 mg/dL (ref <0.60)

## 2011-07-23 LAB — URINE CULTURE

## 2011-08-10 ENCOUNTER — Other Ambulatory Visit: Payer: Self-pay | Admitting: Family Medicine

## 2011-08-24 ENCOUNTER — Other Ambulatory Visit: Payer: Self-pay | Admitting: Family Medicine

## 2011-08-24 DIAGNOSIS — F329 Major depressive disorder, single episode, unspecified: Secondary | ICD-10-CM

## 2011-09-13 ENCOUNTER — Other Ambulatory Visit: Payer: Self-pay | Admitting: Family Medicine

## 2011-11-08 ENCOUNTER — Other Ambulatory Visit: Payer: Self-pay | Admitting: Family Medicine

## 2011-11-09 ENCOUNTER — Other Ambulatory Visit: Payer: Self-pay | Admitting: Family Medicine

## 2011-12-09 ENCOUNTER — Other Ambulatory Visit: Payer: Self-pay | Admitting: Family Medicine

## 2012-02-23 ENCOUNTER — Other Ambulatory Visit: Payer: Self-pay | Admitting: Family Medicine

## 2012-02-24 ENCOUNTER — Other Ambulatory Visit: Payer: Self-pay | Admitting: Family Medicine

## 2012-03-08 ENCOUNTER — Ambulatory Visit: Payer: PRIVATE HEALTH INSURANCE | Admitting: Family Medicine

## 2012-03-14 ENCOUNTER — Ambulatory Visit (INDEPENDENT_AMBULATORY_CARE_PROVIDER_SITE_OTHER): Payer: PRIVATE HEALTH INSURANCE | Admitting: Family Medicine

## 2012-03-14 ENCOUNTER — Encounter: Payer: Self-pay | Admitting: Family Medicine

## 2012-03-14 VITALS — BP 143/87 | HR 59 | Wt 166.0 lb

## 2012-03-14 DIAGNOSIS — M949 Disorder of cartilage, unspecified: Secondary | ICD-10-CM

## 2012-03-14 DIAGNOSIS — J309 Allergic rhinitis, unspecified: Secondary | ICD-10-CM

## 2012-03-14 DIAGNOSIS — M899 Disorder of bone, unspecified: Secondary | ICD-10-CM

## 2012-03-14 DIAGNOSIS — M2550 Pain in unspecified joint: Secondary | ICD-10-CM

## 2012-03-14 DIAGNOSIS — I10 Essential (primary) hypertension: Secondary | ICD-10-CM

## 2012-03-14 DIAGNOSIS — E785 Hyperlipidemia, unspecified: Secondary | ICD-10-CM

## 2012-03-14 DIAGNOSIS — M5430 Sciatica, unspecified side: Secondary | ICD-10-CM

## 2012-03-14 MED ORDER — SIMVASTATIN 40 MG PO TABS: 40.0000 mg | ORAL_TABLET | Freq: Every evening | ORAL | Status: DC

## 2012-03-14 MED ORDER — LISINOPRIL 40 MG PO TABS: ORAL_TABLET | ORAL | Status: DC

## 2012-03-14 MED ORDER — FLUOXETINE HCL 10 MG PO CAPS: ORAL_CAPSULE | ORAL | Status: DC

## 2012-03-14 MED ORDER — LORATADINE 10 MG PO TABS: 10.0000 mg | ORAL_TABLET | Freq: Every day | ORAL | Status: DC

## 2012-03-14 MED ORDER — ALENDRONATE SODIUM 70 MG PO TABS: 70.0000 mg | ORAL_TABLET | ORAL | Status: DC

## 2012-03-14 MED ORDER — MELOXICAM 7.5 MG PO TABS: 7.5000 mg | ORAL_TABLET | Freq: Two times a day (BID) | ORAL | Status: DC | PRN

## 2012-03-14 NOTE — Progress Notes (Signed)
  Subjective:    Patient ID: Denise Rojas [place], female    DOB: 09-Dec-1955, 57 y.o.   MRN: [id number]  HPI HTN -  Pt denies chest pain, SOB, dizziness, or heart palpitations.  Taking meds as directed w/o problems.  Denies medication side effects.  Takes medication at night. N odecongestants.    Hyperlipidemia - On her statin but says the lipitor is getting expensive. Says she has cut back on portions. No regular exercise.    Wants me to look in her left ear bc itching. No pain or discharge.  No fever or cold sxs. Does have seasonal allergies.    She uses her computer at home as needed. Often times twice a day. The co-pay has increased and would like to try something else that might be less expensive. She  Allergic rhinitis-she's currently been using Allegra but it has gotten a little more extensive as well. She wants and if they have any recommendations for over-the-counter allergy medications that might be cheaper.  Review of Systems     Objective:   Physical Exam  Constitutional: She is oriented to person, place, and time. She appears well-developed and well-nourished.  HENT:  Head: Normocephalic and atraumatic.  Left TM and canal are clear.    Cardiovascular: Normal rate, regular rhythm and normal heart sounds.   Pulmonary/Chest: Effort normal and breath sounds normal.  Neurological: She is alert and oriented to person, place, and time.  Skin: Skin is warm and dry.  Psychiatric: She has a normal mood and affect. Her behavior is normal.          Assessment & Plan:  HTN - Uncontrolled.  Will increase lisinopril to 40mg  and f/U in 6 weeks.   Hyperlipidemia - Will change to simvastatin because of cost.. Recheck lipids. At followup in 6 weeks and let me know if she is tolerating it well.  Sciatica and bursitis-she typically uses nabumetone for pain relief but the co-pay has gone up and she would like to try something else. She did do well on piroxicam in the past but we were  concerned it may have been causing some elevation her blood pressures time. We will try meloxicam and see if this is helpful. Followup in 6 weeks.  Allergic rhinitis-recommend switch to Claritin. Was the first of the generic and typically can buy a ball from a cheaper price. I did read it as a prescription as well to see if it might be cheaper for her through her insurance. The pharmacy will let her know if she has to get over-the-counter or not.  Osteopenia-due for bone density test. Ordered.  Did refill her alendronate.

## 2012-03-22 ENCOUNTER — Other Ambulatory Visit: Payer: Self-pay | Admitting: Family Medicine

## 2012-03-22 DIAGNOSIS — Z1231 Encounter for screening mammogram for malignant neoplasm of breast: Secondary | ICD-10-CM

## 2012-03-22 LAB — LIPID PANEL
Cholesterol: 181 mg/dL (ref 0–200)
HDL: 40 mg/dL (ref >39)
Total CHOL/HDL Ratio: 4.5 Ratio

## 2012-03-22 LAB — COMPLETE METABOLIC PANEL WITH GFR
Alkaline Phosphatase: 41 U/L (ref 39–117)
BUN: 15 mg/dL (ref 6–23)
GFR, Est Non African American: 86 mL/min
Glucose, Bld: 87 mg/dL (ref 70–99)
Sodium: 140 mEq/L (ref 135–145)
Total Bilirubin: 0.5 mg/dL (ref 0.3–1.2)

## 2012-04-02 ENCOUNTER — Ambulatory Visit: Payer: BC Managed Care – PPO

## 2012-04-02 ENCOUNTER — Ambulatory Visit (INDEPENDENT_AMBULATORY_CARE_PROVIDER_SITE_OTHER): Payer: BC Managed Care – PPO

## 2012-04-02 DIAGNOSIS — Z13820 Encounter for screening for osteoporosis: Secondary | ICD-10-CM

## 2012-04-02 DIAGNOSIS — M899 Disorder of bone, unspecified: Secondary | ICD-10-CM

## 2012-04-04 ENCOUNTER — Ambulatory Visit: Payer: BC Managed Care – PPO

## 2012-04-09 ENCOUNTER — Ambulatory Visit: Payer: BC Managed Care – PPO

## 2012-06-04 ENCOUNTER — Ambulatory Visit (INDEPENDENT_AMBULATORY_CARE_PROVIDER_SITE_OTHER): Payer: BC Managed Care – PPO

## 2012-06-04 DIAGNOSIS — Z1231 Encounter for screening mammogram for malignant neoplasm of breast: Secondary | ICD-10-CM

## 2012-08-05 ENCOUNTER — Ambulatory Visit (INDEPENDENT_AMBULATORY_CARE_PROVIDER_SITE_OTHER): Payer: BC Managed Care – PPO | Admitting: Family Medicine

## 2012-08-05 ENCOUNTER — Encounter: Payer: Self-pay | Admitting: Family Medicine

## 2012-08-05 ENCOUNTER — Ambulatory Visit (INDEPENDENT_AMBULATORY_CARE_PROVIDER_SITE_OTHER): Payer: PRIVATE HEALTH INSURANCE

## 2012-08-05 VITALS — BP 109/67 | HR 62 | Wt 172.0 lb

## 2012-08-05 DIAGNOSIS — M79609 Pain in unspecified limb: Secondary | ICD-10-CM

## 2012-08-05 DIAGNOSIS — R5383 Other fatigue: Secondary | ICD-10-CM

## 2012-08-05 DIAGNOSIS — Z6828 Body mass index (BMI) 28.0-28.9, adult: Secondary | ICD-10-CM

## 2012-08-05 DIAGNOSIS — M2550 Pain in unspecified joint: Secondary | ICD-10-CM

## 2012-08-05 DIAGNOSIS — R5381 Other malaise: Secondary | ICD-10-CM

## 2012-08-05 MED ORDER — DICLOFENAC SODIUM 3 % TD GEL: 1.0000 "application " | Freq: Four times a day (QID) | TRANSDERMAL | Status: DC | PRN

## 2012-08-05 NOTE — Progress Notes (Signed)
  Subjective:    Patient  ID: Denise Rojas, female    DOB: Jun 06, 1955, 57 y.o.   MRN: [id number]  HPI Feels really tired aorund 2PM every day.  She is not sure why. Says she sleep well.   Says her hands are hurting.  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  Says her whole hand will throb.  There are times when  she notices her hands are drawing.  + fam hx fo RA, mother, sister.  Says occ the PIPs will swell.  Sometimes can't get her rings off. Bilat knee pain as well.  Using a bengay for arthritis at night. C/o stifness at times. Worse with activity.    Review of Systems     Objective:   Physical Exam  Constitutional: She is oriented to person, place, and time. She appears well-developed and well-nourished.  HENT:  Head: Normocephalic and atraumatic.  Musculoskeletal:  Hands appear normal.  No joint swelling. She is tender with squeeze test  Bilat. Tender over the PIPs 2nd through 4th fingers bilat.  No swelling or tenderness over her wrist.   Neurological: She is alert and oriented to person, place, and time.  Skin: Skin is warm and dry.  Psychiatric: She has a normal mood and affect. Her behavior is normal.          Assessment & Plan:  Polyarthralgia of the hands bilateral-suspect osteoarthritis based on her description based on her exam today. It does sound like she gets some intermittent swelling of the joints. The she was not able to localize it to specific ones. She says it affects all joints at some point in time. We will do further workup with lab work as well as x-rays to evaluate for rheumatoid arthritis and other autoimmune arthritis disorders versus just wear and tear osteoarthritis. 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  Fatigue around 2Pm.  Interestingly her blood pressure is a little bit low even for her today. She takes her blood pressure pill in the evenings. Encouraged her to  check her blood pressure at home during the time that she feels very fatigued in the afternoons and see if that might actually be getting low. Make sure hydrating well and make sure eating adequately. She says she does get meals at times.

## 2012-08-06 LAB — CBC WITH DIFFERENTIAL/PLATELET
Basophils Relative: 0 % (ref 0–1)
HCT: 41.7 % (ref 36.0–46.0)
Hemoglobin: 13.9 g/dL (ref 12.0–15.0)
MCH: 29.4 pg (ref 26.0–34.0)
MCHC: 33.3 g/dL (ref 30.0–36.0)
MCV: 88.2 fL (ref 78.0–100.0)
Monocytes Absolute: 0.4 10*3/uL (ref 0.1–1.0)
Monocytes Relative: 9 % (ref 3–12)
Neutro Abs: 2.4 10*3/uL (ref 1.7–7.7)

## 2012-08-12 ENCOUNTER — Ambulatory Visit: Payer: PRIVATE HEALTH INSURANCE | Admitting: Sports Medicine

## 2012-08-13 ENCOUNTER — Ambulatory Visit (INDEPENDENT_AMBULATORY_CARE_PROVIDER_SITE_OTHER): Payer: PRIVATE HEALTH INSURANCE | Admitting: Sports Medicine

## 2012-08-13 ENCOUNTER — Encounter: Payer: Self-pay | Admitting: Sports Medicine

## 2012-08-13 VITALS — BP 119/72 | HR 61 | Wt 172.0 lb

## 2012-08-13 DIAGNOSIS — G5603 Carpal tunnel syndrome, bilateral upper limbs: Secondary | ICD-10-CM | POA: Insufficient documentation

## 2012-08-13 DIAGNOSIS — G5600 Carpal tunnel syndrome, unspecified upper limb: Secondary | ICD-10-CM

## 2012-08-13 NOTE — Progress Notes (Signed)
Subjective:    I'm seeing this patient as a consultation for:  Dr. 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 Symptoms are moderate, persistent. She does have some neck pain but denies any pain or tingling radiating down the upper arms into the fingers. She has had x-rays but no interventional treatment, therapy, or bracing.  Past medical history, Surgical history, Family history not pertinant except as noted below, Social history, Allergies, and medications have been entered into the medical record, reviewed, and no changes needed.   Review of Systems: No headache, visual changes, nausea, vomiting, diarrhea, constipation, dizziness, abdominal pain, skin rash, fevers, chills, night sweats, weight loss, swollen lymph nodes, body aches, joint swelling, muscle aches, chest pain, shortness of breath, mood changes, visual or auditory hallucinations.   Objective:   General: Well Developed, well nourished, and in no acute distress.  Neuro/Psych: Alert and oriented x3, extra-ocular muscles intact, able to move all 4 extremities, sensation grossly intact. Skin: Warm and dry, no rashes noted.  Respiratory: Not using accessory muscles, speaking in full sentences, trachea midline.  Cardiovascular: Pulses palpable, no extremity edema. Abdomen: Does not appear distended. Bilateral Wrist: Inspection normal with no visible erythema or swelling. No tenderness to palpation of any of the metacarpophalangeal, or interphalangeal joints, no palpable synovitis or swelling. ROM smooth and normal with good flexion and extension and ulnar/radial deviation that is symmetrical with opposite  wrist. Palpation is normal over metacarpals, navicular, lunate, and TFCC; tendons without tenderness/ swelling No snuffbox tenderness. No tenderness over Canal of Guyon. Strength 5/5 in all directions without pain. Negative Finkelstein sign, positive Tinel's, positive Phalen signs. Negative Watson's test.  X-rays were reviewed and are negative for any overt degenerative changes.  Procedure: Real-time Ultrasound Guided Injection of left carpal tunnel Device: GE Logiq E  Verbal informed consent obtained.  Time-out conducted.  Noted no overlying erythema, induration, or other signs of local infection.  Skin prepped in a sterile fashion.  Local anesthesia: Topical Ethyl chloride.  With sterile technique and under real time ultrasound guidance:  25-gauge needle used to inject medications with superficial to and deep to the median nerve, a total of 1 cc Kenalog 40, 4 cc lidocaine used. Completed without difficulty  Pain immediately resolved suggesting accurate placement of the medication.  Advised to call if fevers/chills, erythema, induration, drainage, or persistent bleeding.  Images permanently stored and available for review in the ultrasound unit.  Impression: Technically successful ultrasound guided injection.  Procedure: Real-time Ultrasound Guided Injection of right carpal tunnel Device: GE Logiq E  Verbal informed consent obtained.  Time-out conducted.  Noted no overlying erythema, induration, or other signs of local infection.  Skin prepped in a sterile fashion.  Local anesthesia: Topical Ethyl chloride.  With sterile technique and under real time ultrasound guidance:  25-gauge needle used to inject medications with superficial to and deep to the median nerve, a total of 1 cc Kenalog 40, 4 cc lidocaine used. Completed without difficulty  Pain immediately resolved suggesting accurate placement of the medication.  Advised to call if fevers/chills, erythema, induration, drainage,  or persistent bleeding.  Images permanently stored and available for review in  the ultrasound unit.  Impression: Technically successful ultrasound guided injection.  Bilateral wrist braces given.  Impression and Recommendations:   This case required medical decision making of moderate complexity.

## 2012-08-13 NOTE — Assessment & Plan Note (Signed)
Bilateral median nerve hydro-dissection as above. Home exercises, night splinting, return to see me in one month.

## 2012-08-19 ENCOUNTER — Other Ambulatory Visit: Payer: Self-pay

## 2012-08-19 ENCOUNTER — Telehealth: Payer: Self-pay | Admitting: *Deleted

## 2012-08-19 DIAGNOSIS — E785 Hyperlipidemia, unspecified: Secondary | ICD-10-CM

## 2012-08-19 DIAGNOSIS — I10 Essential (primary) hypertension: Secondary | ICD-10-CM

## 2012-08-19 MED ORDER — LISINOPRIL 40 MG PO TABS: ORAL_TABLET | ORAL | Status: DC

## 2012-08-19 MED ORDER — SIMVASTATIN 40 MG PO TABS: 40.0000 mg | ORAL_TABLET | Freq: Every evening | ORAL | Status: DC

## 2012-08-19 NOTE — Telephone Encounter (Signed)
Patient request that all long term medication be sent in to [facility] with 90 day supply.

## 2012-08-19 NOTE — Telephone Encounter (Signed)
Pt stated that she has been really tired and she took her bp it was 96/65 she cut her dose in 1/2 and her bp is now110/69. Pt stated that she feels better since doing this and is going to continue cutting them in 1/2.Denise Rojas [name]

## 2012-08-19 NOTE — Telephone Encounter (Signed)
Returning pt's call.Denise Rojas  

## 2012-08-20 NOTE — Telephone Encounter (Signed)
Pt informed of recommendations.Denise Rojas  

## 2012-08-20 NOTE — Telephone Encounter (Signed)
Perfect! Sounds good. When due for next refill we can change the dose .

## 2012-08-20 NOTE — Telephone Encounter (Signed)
Okay to hold blood pressure meds completely for the next 2 weeks. See how blood pressure does without it. Make sure drinking plenty of fluids and staying well hydrated.

## 2012-08-20 NOTE — Telephone Encounter (Signed)
Pt stated that she took her bp this am it was 95/65 and this was after taking the 1/2 tab of bp med and doing some chores around the house. She still feels wiped out. Pt told to d/c magnesium supplement for now. Told her to drink plenty of fluids. Will forward to Dr.Metheney for advice.Loralee Pacas [place]

## 2012-08-29 ENCOUNTER — Other Ambulatory Visit: Payer: Self-pay | Admitting: Family Medicine

## 2012-09-10 ENCOUNTER — Other Ambulatory Visit: Payer: Self-pay | Admitting: *Deleted

## 2012-09-10 ENCOUNTER — Ambulatory Visit (INDEPENDENT_AMBULATORY_CARE_PROVIDER_SITE_OTHER): Payer: PRIVATE HEALTH INSURANCE | Admitting: Sports Medicine

## 2012-09-10 ENCOUNTER — Encounter: Payer: Self-pay | Admitting: Sports Medicine

## 2012-09-10 VITALS — BP 149/73 | HR 59 | Wt 172.0 lb

## 2012-09-10 DIAGNOSIS — G5600 Carpal tunnel syndrome, unspecified upper limb: Secondary | ICD-10-CM

## 2012-09-10 DIAGNOSIS — G5603 Carpal tunnel syndrome, bilateral upper limbs: Secondary | ICD-10-CM

## 2012-09-10 MED ORDER — MELOXICAM 7.5 MG PO TABS: 7.5000 mg | ORAL_TABLET | Freq: Two times a day (BID) | ORAL | Status: DC | PRN

## 2012-09-10 MED ORDER — FLUOXETINE HCL 10 MG PO CAPS: ORAL_CAPSULE | ORAL | Status: DC

## 2012-09-10 NOTE — Assessment & Plan Note (Addendum)
Completely resolved after bilateral median nerve hydrodissection. Return as needed. We can repeat this approximately 4 times per year if needed.

## 2012-09-10 NOTE — Progress Notes (Signed)
  Subjective:    CC: Followup  HPI: Bilateral carpal tunnel syndrome: I performed bilateral median nerve dissection approximately one month ago, patient returns with symptoms completely resolve, happy with results. She did have several hours of numbness and was unable to work after the procedure, the next day she felt fine.  Past medical history, Surgical history, Family history not pertinant except as noted below, Social history, Allergies, and medications have been entered into the medical record, reviewed, and no changes needed.   Review of Systems: No fevers, chills, night sweats, weight loss, chest pain, or shortness of breath.   Objective:    General: Well Developed, well nourished, and in no acute distress.  Neuro: Alert and oriented x3, extra-ocular muscles intact, sensation grossly intact.  HEENT: Normocephalic, atraumatic, pupils equal round reactive to light, neck supple, no masses, no lymphadenopathy, thyroid nonpalpable.  Skin: Warm and dry, no rashes. Cardiac: Regular rate and rhythm, no murmurs rubs or gallops, no lower extremity edema.  Respiratory: Clear to auscultation bilaterally. Not using accessory muscles, speaking in full sentences. Bilateral Wrist: Inspection normal with no visible erythema or swelling. ROM smooth and normal with good flexion and extension and ulnar/radial deviation that is symmetrical with opposite wrist. Palpation is normal over metacarpals, navicular, lunate, and TFCC; tendons without tenderness/ swelling No snuffbox tenderness. No tenderness over Canal of Guyon. Strength 5/5 in all directions without pain. Negative Finkelstein, tinel's and phalens. Negative Watson's test.  Impression and Recommendations:

## 2012-10-06 ENCOUNTER — Other Ambulatory Visit: Payer: Self-pay | Admitting: Family Medicine

## 2012-11-07 ENCOUNTER — Other Ambulatory Visit: Payer: Self-pay

## 2012-12-03 ENCOUNTER — Encounter: Payer: Self-pay | Admitting: Family Medicine

## 2012-12-03 ENCOUNTER — Ambulatory Visit (INDEPENDENT_AMBULATORY_CARE_PROVIDER_SITE_OTHER): Payer: PRIVATE HEALTH INSURANCE | Admitting: Family Medicine

## 2012-12-03 VITALS — BP 143/79 | HR 63 | Temp 97.9°F | Ht 65.0 in | Wt 174.0 lb

## 2012-12-03 DIAGNOSIS — M2550 Pain in unspecified joint: Secondary | ICD-10-CM

## 2012-12-03 DIAGNOSIS — M899 Disorder of bone, unspecified: Secondary | ICD-10-CM

## 2012-12-03 DIAGNOSIS — L989 Disorder of the skin and subcutaneous tissue, unspecified: Secondary | ICD-10-CM

## 2012-12-03 DIAGNOSIS — L608 Other nail disorders: Secondary | ICD-10-CM

## 2012-12-03 DIAGNOSIS — L603 Nail dystrophy: Secondary | ICD-10-CM

## 2012-12-03 DIAGNOSIS — E785 Hyperlipidemia, unspecified: Secondary | ICD-10-CM

## 2012-12-03 DIAGNOSIS — R030 Elevated blood-pressure reading, without diagnosis of hypertension: Secondary | ICD-10-CM

## 2012-12-03 DIAGNOSIS — Z23 Encounter for immunization: Secondary | ICD-10-CM

## 2012-12-03 MED ORDER — DICLOFENAC SODIUM 1 % TD GEL: 4.0000 g | Freq: Four times a day (QID) | TRANSDERMAL | Status: DC

## 2012-12-03 MED ORDER — ALENDRONATE SODIUM 70 MG PO TABS: 70.0000 mg | ORAL_TABLET | ORAL | Status: DC

## 2012-12-03 MED ORDER — FLUOXETINE HCL 10 MG PO CAPS: ORAL_CAPSULE | ORAL | Status: DC

## 2012-12-03 MED ORDER — MELOXICAM 7.5 MG PO TABS: 7.5000 mg | ORAL_TABLET | Freq: Two times a day (BID) | ORAL | Status: DC | PRN

## 2012-12-03 MED ORDER — SIMVASTATIN 40 MG PO TABS: 40.0000 mg | ORAL_TABLET | Freq: Every evening | ORAL | Status: DC

## 2012-12-03 NOTE — Progress Notes (Signed)
   Subjective:    Patient ID: Denise Rojas, female    DOB: 1955/09/21, 57 y.o.   MRN: [id number]  HPI HTN -  Pt denies chest pain, SOB, dizziness, or heart palpitations. She stopped her medication several months ago and has been working on diet. She felt tired on the medication..  Home BPs running 135/69s on average. She has some quesitons about some supplements.   Splitting brittle nails- she has been taking biotin but not really helping. No harsh chemicals or exposure.   OA - she is taking a jointsupplement . She wanted to look at the bottle and make sure that it was safe. Predominantly has glucosamine in it. She also wanted to try Voltaren gel. She has tried a sample of it previously and found it very helpful. She would like a refill med as well to take occasionally when she does not use the gel.  Hyperlipidemia-tolerating statin well without any side effects or problems.  Osteoporosis-tolerating Fosamax well. Needs refills on medication. Last bone density April 2014. Review of Systems     Objective:   Physical Exam  Constitutional: She is oriented to person, place, and time. She appears well-developed and well-nourished.  HENT:  Head: Normocephalic and atraumatic.  Neck: Neck supple. No thyromegaly present.  Cardiovascular: Normal rate, regular rhythm and normal heart sounds.   No carotid bruits  Pulmonary/Chest: Effort normal and breath sounds normal.  Lymphadenopathy:    She has no cervical adenopathy.  Neurological: She is alert and oriented to person, place, and time.  Skin: Skin is warm and dry.  Psychiatric: She has a normal mood and affect. Her behavior is normal.          Assessment & Plan:  HTN - Well controlled at home. Just borderline elevated here. We will continue to keep an eye on it. Continue current regimen. Call if there's any problems or side effects. Followup in 6 months.  Brittle nails -She can certainly try vitamin E for 2-3 months if needed. Do not  recommend long-term. She started taking biotin which has not helped.  Polyarthralgia/osteoporosis-wasn't prescription for her [facility]. If she does well then we consider sending to her mail order pharmacy. [facility] for medics and to the pharmacy.  Hyperlipidemia-due for refills on statin. To mail order pharmacy.  Osteoporosis-refilled Fosamax to her mail order. Repeat bone density in April of 2016

## 2012-12-04 ENCOUNTER — Telehealth: Payer: Self-pay

## 2012-12-04 MED ORDER — DICLOFENAC SODIUM 1 % TD GEL: 4.0000 g | Freq: Four times a day (QID) | TRANSDERMAL | Status: DC

## 2012-12-04 NOTE — Telephone Encounter (Signed)
Sent Voltaren prescription.

## 2012-12-04 NOTE — Telephone Encounter (Signed)
She wants her solareze cream sent to [facility]. I do not see this cream on her medication list. Please advise.

## 2012-12-04 NOTE — Telephone Encounter (Signed)
I am not sure what this is?  Is she talking about the voltaran gel?

## 2013-03-27 ENCOUNTER — Encounter: Payer: Self-pay | Admitting: Sports Medicine

## 2013-03-27 ENCOUNTER — Ambulatory Visit (INDEPENDENT_AMBULATORY_CARE_PROVIDER_SITE_OTHER): Payer: PRIVATE HEALTH INSURANCE

## 2013-03-27 ENCOUNTER — Ambulatory Visit (INDEPENDENT_AMBULATORY_CARE_PROVIDER_SITE_OTHER): Payer: PRIVATE HEALTH INSURANCE | Admitting: Sports Medicine

## 2013-03-27 VITALS — BP 122/77 | HR 70 | Ht 64.0 in | Wt 170.0 lb

## 2013-03-27 DIAGNOSIS — M5412 Radiculopathy, cervical region: Secondary | ICD-10-CM

## 2013-03-27 DIAGNOSIS — G5603 Carpal tunnel syndrome, bilateral upper limbs: Secondary | ICD-10-CM

## 2013-03-27 DIAGNOSIS — M542 Cervicalgia: Secondary | ICD-10-CM

## 2013-03-27 DIAGNOSIS — G5600 Carpal tunnel syndrome, unspecified upper limb: Secondary | ICD-10-CM

## 2013-03-27 MED ORDER — MELOXICAM 15 MG PO TABS
ORAL_TABLET | ORAL | Status: DC
Start: 2013-03-27 — End: 2014-01-27

## 2013-03-27 MED ORDER — MELOXICAM 15 MG PO TABS: ORAL_TABLET | ORAL | Status: DC

## 2013-03-27 MED ORDER — PREDNISONE 50 MG PO TABS: ORAL_TABLET | ORAL | Status: DC

## 2013-03-27 NOTE — Assessment & Plan Note (Addendum)
Although she does have bilateral carpal tunnel syndrome, and did extremely well after bilateral median nerve hydrodissection in September of last year, I do think her current symptoms are predominantly radicular.

## 2013-03-27 NOTE — Assessment & Plan Note (Signed)
Right-sided C7 clinically. X-rays, formal physical therapy, prednisone, Mobic, return to see me in one month, MRI for interventional injection planning if no better. May discontinue wrist brace.

## 2013-03-27 NOTE — Progress Notes (Signed)
  Subjective:    CC: Follow up  HPI: Right arm pain: Denise Rojas has a history of carpal tunnel syndrome, she had a fantastic response to a bilateral median nerve hydrodissection approximately 6 months ago. Unfortunately she is now developing a different pain than she localizes from the posterior neck, down the posterior aspect of the upper arm, lower arm, to the dorsal hand with slight numbness and tingling in the volar hand. Symptoms are moderate, persistent.  Past medical history, Surgical history, Family history not pertinant except as noted below, Social history, Allergies, and medications have been entered into the medical record, reviewed, and no changes needed.   Review of Systems: No fevers, chills, night sweats, weight loss, chest pain, or shortness of breath.   Objective:    General: Well Developed, well nourished, and in no acute distress.  Neuro: Alert and oriented x3, extra-ocular muscles intact, sensation grossly intact.  HEENT: Normocephalic, atraumatic, pupils equal round reactive to light, neck supple, no masses, no lymphadenopathy, thyroid nonpalpable.  Skin: Warm and dry, no rashes. Cardiac: Regular rate and rhythm, no murmurs rubs or gallops, no lower extremity edema.  Respiratory: Clear to auscultation bilaterally. Not using accessory muscles, speaking in full sentences. Neck: Inspection unremarkable. No palpable stepoffs. Negative Spurling's maneuver. Full neck range of motion Grip strength and sensation normal in bilateral hands Strength good C4 to T1 distribution No sensory change to C4 to T1 Negative Hoffman sign bilaterally Reflexes normal  Cervical spine x-rays were reviewed and do show loss of disc height from C4-C7. It is worst with some anterior bridging osteophytes between C6 and C7.  Impression and Recommendations:

## 2013-04-07 ENCOUNTER — Ambulatory Visit (INDEPENDENT_AMBULATORY_CARE_PROVIDER_SITE_OTHER): Payer: PRIVATE HEALTH INSURANCE | Admitting: Physical Therapy

## 2013-04-07 DIAGNOSIS — R293 Abnormal posture: Secondary | ICD-10-CM

## 2013-04-07 DIAGNOSIS — M5412 Radiculopathy, cervical region: Secondary | ICD-10-CM

## 2013-04-07 DIAGNOSIS — M542 Cervicalgia: Secondary | ICD-10-CM

## 2013-04-07 DIAGNOSIS — M25519 Pain in unspecified shoulder: Secondary | ICD-10-CM

## 2013-04-11 ENCOUNTER — Encounter (INDEPENDENT_AMBULATORY_CARE_PROVIDER_SITE_OTHER): Payer: PRIVATE HEALTH INSURANCE

## 2013-04-11 DIAGNOSIS — M542 Cervicalgia: Secondary | ICD-10-CM

## 2013-04-11 DIAGNOSIS — M25519 Pain in unspecified shoulder: Secondary | ICD-10-CM

## 2013-04-11 DIAGNOSIS — R293 Abnormal posture: Secondary | ICD-10-CM

## 2013-04-11 DIAGNOSIS — IMO0002 Reserved for concepts with insufficient information to code with codable children: Secondary | ICD-10-CM

## 2013-04-16 ENCOUNTER — Encounter (INDEPENDENT_AMBULATORY_CARE_PROVIDER_SITE_OTHER): Payer: PRIVATE HEALTH INSURANCE

## 2013-04-16 DIAGNOSIS — M5412 Radiculopathy, cervical region: Secondary | ICD-10-CM

## 2013-04-16 DIAGNOSIS — M25519 Pain in unspecified shoulder: Secondary | ICD-10-CM

## 2013-04-16 DIAGNOSIS — M542 Cervicalgia: Secondary | ICD-10-CM

## 2013-04-16 DIAGNOSIS — R293 Abnormal posture: Secondary | ICD-10-CM

## 2013-04-18 ENCOUNTER — Encounter (INDEPENDENT_AMBULATORY_CARE_PROVIDER_SITE_OTHER): Payer: PRIVATE HEALTH INSURANCE

## 2013-04-18 DIAGNOSIS — M5412 Radiculopathy, cervical region: Secondary | ICD-10-CM

## 2013-04-18 DIAGNOSIS — M25519 Pain in unspecified shoulder: Secondary | ICD-10-CM

## 2013-04-18 DIAGNOSIS — R293 Abnormal posture: Secondary | ICD-10-CM

## 2013-04-18 DIAGNOSIS — M542 Cervicalgia: Secondary | ICD-10-CM

## 2013-04-24 ENCOUNTER — Encounter: Payer: Self-pay | Admitting: Sports Medicine

## 2013-04-24 ENCOUNTER — Ambulatory Visit (INDEPENDENT_AMBULATORY_CARE_PROVIDER_SITE_OTHER): Payer: PRIVATE HEALTH INSURANCE | Admitting: Sports Medicine

## 2013-04-24 VITALS — BP 139/78 | HR 67 | Ht 64.0 in | Wt 173.0 lb

## 2013-04-24 DIAGNOSIS — M5412 Radiculopathy, cervical region: Secondary | ICD-10-CM

## 2013-04-24 NOTE — Assessment & Plan Note (Signed)
Greatly improved, she does desire to do an additional course of physical therapy. Return as needed.

## 2013-04-24 NOTE — Progress Notes (Signed)
  Subjective:    CC: Followup  HPI: Cervical radiculitis: Overall was greatly improved with physical therapy, steroids, and NSAIDs, she worked for hours in her garden recently and now is having a recurrence of symptoms. She does desire to proceed further with another course of physical therapy. She is also using a TENS unit which is significantly helpful. Symptoms are moderate, persistent.  Past medical history, Surgical history, Family history not pertinant except as noted below, Social history, Allergies, and medications have been entered into the medical record, reviewed, and no changes needed.   Review of Systems: No fevers, chills, night sweats, weight loss, chest pain, or shortness of breath.   Objective:    General: Well Developed, well nourished, and in no acute distress.  Neuro: Alert and oriented x3, extra-ocular muscles intact, sensation grossly intact.  HEENT: Normocephalic, atraumatic, pupils equal round reactive to light, neck supple, no masses, no lymphadenopathy, thyroid nonpalpable.  Skin: Warm and dry, no rashes. Cardiac: Regular rate and rhythm, no murmurs rubs or gallops, no lower extremity edema.  Respiratory: Clear to auscultation bilaterally. Not using accessory muscles, speaking in full sentences. Neck: Inspection unremarkable. No palpable stepoffs. Negative Spurling's maneuver. Full neck range of motion Grip strength and sensation normal in bilateral hands Strength good C4 to T1 distribution No sensory change to C4 to T1 Negative Hoffman sign bilaterally Reflexes normal Impression and Recommendations:

## 2013-04-25 ENCOUNTER — Encounter (INDEPENDENT_AMBULATORY_CARE_PROVIDER_SITE_OTHER): Payer: PRIVATE HEALTH INSURANCE | Admitting: Physical Therapy

## 2013-04-25 DIAGNOSIS — M25519 Pain in unspecified shoulder: Secondary | ICD-10-CM

## 2013-04-25 DIAGNOSIS — M542 Cervicalgia: Secondary | ICD-10-CM

## 2013-04-25 DIAGNOSIS — R293 Abnormal posture: Secondary | ICD-10-CM

## 2013-04-25 DIAGNOSIS — M5412 Radiculopathy, cervical region: Secondary | ICD-10-CM

## 2013-04-28 ENCOUNTER — Encounter (INDEPENDENT_AMBULATORY_CARE_PROVIDER_SITE_OTHER): Payer: PRIVATE HEALTH INSURANCE | Admitting: Physical Therapy

## 2013-04-28 DIAGNOSIS — M542 Cervicalgia: Secondary | ICD-10-CM

## 2013-04-28 DIAGNOSIS — M25519 Pain in unspecified shoulder: Secondary | ICD-10-CM

## 2013-04-28 DIAGNOSIS — R293 Abnormal posture: Secondary | ICD-10-CM

## 2013-04-28 DIAGNOSIS — M5412 Radiculopathy, cervical region: Secondary | ICD-10-CM

## 2013-06-25 ENCOUNTER — Encounter: Payer: Self-pay | Admitting: Family Medicine

## 2013-06-25 ENCOUNTER — Ambulatory Visit (INDEPENDENT_AMBULATORY_CARE_PROVIDER_SITE_OTHER): Payer: PRIVATE HEALTH INSURANCE | Admitting: Family Medicine

## 2013-06-25 VITALS — BP 128/76 | HR 55 | Wt 169.0 lb

## 2013-06-25 DIAGNOSIS — M949 Disorder of cartilage, unspecified: Secondary | ICD-10-CM

## 2013-06-25 DIAGNOSIS — E785 Hyperlipidemia, unspecified: Secondary | ICD-10-CM

## 2013-06-25 DIAGNOSIS — M5412 Radiculopathy, cervical region: Secondary | ICD-10-CM

## 2013-06-25 DIAGNOSIS — I10 Essential (primary) hypertension: Secondary | ICD-10-CM

## 2013-06-25 DIAGNOSIS — M899 Disorder of bone, unspecified: Secondary | ICD-10-CM

## 2013-06-25 MED ORDER — SIMVASTATIN 40 MG PO TABS: 40.0000 mg | ORAL_TABLET | Freq: Every evening | ORAL | Status: DC

## 2013-06-25 MED ORDER — DICLOFENAC SODIUM 3 % TD GEL: 1.0000 "application " | Freq: Four times a day (QID) | TRANSDERMAL | Status: DC | PRN

## 2013-06-25 MED ORDER — LORATADINE 10 MG PO TABS: ORAL_TABLET | ORAL | Status: DC

## 2013-06-25 NOTE — Progress Notes (Signed)
   Subjective:    Patient ID: Denise Rojas, female    DOB: [birth year], 58 y.o.   MRN: [id number]  HPI Hypertension- Pt denies chest pain, SOB, dizziness, or heart palpitations.  Taking meds as directed w/o problems.  Denies medication side effects.    Osteoporosis - want to know if can come off teh fosamax.  She is worried about osteonecrosis of the jaw and brittle teeth.  She wants to discuss this more.    Alllergies - needs refill on allergy medication. She is doing well overall    Hyperlipidemia-tolerating statin well without any side effects or myalgias.  Lipid Panel     Component Value Date/Time   CHOL 181 03/22/2012 0945   TRIG 92 03/22/2012 0945   HDL 40 03/22/2012 0945   CHOLHDL 4.5 03/22/2012 0945   VLDL 18 03/22/2012 0945   LDLCALC 123* 03/22/2012 0945   Neck pain-she's been following Dr. Benjamin Stainhekkekandam. She did get some improvement in her pain with physical therapy. She wants to know if the chiropractor would be helpful as well. She continues to disease medications and anti-inflammatories as needed. She really is not interested in surgery at this time. She would like to have a refill on generic diclofenac gel sent to her mail order pharmacy. She still gets numbness down her right arm every night. She's not had any significant weakness in that arm.  Review of Systems     Objective:   Physical Exam  Constitutional: She is oriented to person, place, and time. She appears well-developed and well-nourished.  HENT:  Head: Normocephalic and atraumatic.  Cardiovascular: Normal rate, regular rhythm and normal heart sounds.   Pulmonary/Chest: Effort normal and breath sounds normal.  Neurological: She is alert and oriented to person, place, and time.  Skin: Skin is warm and dry.  Psychiatric: She has a normal mood and affect. Her behavior is normal.          Assessment & Plan:  HTN - well controlled.  Continue current regimen. Followup in 6 months. Due for repeat CMP to follow  kidney function and potassium.  Hyperlipidemia - due for repeat lipid panel. Continue current regimen.  Osteoprosis - discussed that since she has been on the drug for 5 years and last T score was -0.8 last year, so we could certainly take a drug holiday and continue to follow her bone density scan every 2 years. If it starts to decline again then we may need to restart the medication. She still has some left over so is completely up to her if she wants to continue it for now or discontinue it.  Neck pain-refill her generic diclofenac gel to her mail order pharmacy. She said she had asked for it to be than last year and it was never sent. When I look back through the medication she evidently was [facility]ed it to [facility] here locally back in August but she never received a call informing her that it was there. We also discussed conservative therapy versus possibly chiropractic care, and when necessary medications I think that's the way she should go. If it becomes more pervasive and is affecting her daily life and she might want to consider surgery at that point.surgery. If she can maintain and help control her symptoms with physical therapy,

## 2013-06-26 LAB — COMPLETE METABOLIC PANEL WITH GFR
ALBUMIN: 4.5 g/dL (ref 3.5–5.2)
ALT: 19 U/L (ref 0–35)
AST: 24 U/L (ref 0–37)
Alkaline Phosphatase: 48 U/L (ref 39–117)
BUN: 24 mg/dL — ABNORMAL HIGH (ref 6–23)
CHLORIDE: 99 meq/L (ref 96–112)
CO2: 29 mEq/L (ref 19–32)
Calcium: 10.1 mg/dL (ref 8.4–10.5)
Creat: 1.17 mg/dL — ABNORMAL HIGH (ref 0.50–1.10)
GFR, Est African American: 59 mL/min — ABNORMAL LOW
GFR, Est Non African American: 51 mL/min — ABNORMAL LOW
Glucose, Bld: 91 mg/dL (ref 70–99)
POTASSIUM: 4.4 meq/L (ref 3.5–5.3)
Sodium: 137 mEq/L (ref 135–145)
Total Bilirubin: 0.5 mg/dL (ref 0.2–1.2)
Total Protein: 7.2 g/dL (ref 6.0–8.3)

## 2013-06-26 LAB — LIPID PANEL
CHOLESTEROL: 254 mg/dL — AB (ref 0–200)
HDL: 42 mg/dL (ref >39)
LDL CALC: 170 mg/dL — AB (ref 0–99)
Total CHOL/HDL Ratio: 6 Ratio
Triglycerides: 209 mg/dL — ABNORMAL HIGH (ref <150)
VLDL: 42 mg/dL — AB (ref 0–40)

## 2013-07-16 ENCOUNTER — Ambulatory Visit (INDEPENDENT_AMBULATORY_CARE_PROVIDER_SITE_OTHER): Payer: PRIVATE HEALTH INSURANCE | Admitting: Sports Medicine

## 2013-07-16 ENCOUNTER — Other Ambulatory Visit: Payer: Self-pay

## 2013-07-16 ENCOUNTER — Telehealth: Payer: Self-pay | Admitting: *Deleted

## 2013-07-16 ENCOUNTER — Encounter: Payer: Self-pay | Admitting: Sports Medicine

## 2013-07-16 VITALS — BP 130/80 | HR 66 | Ht 64.5 in | Wt 171.0 lb

## 2013-07-16 DIAGNOSIS — M5412 Radiculopathy, cervical region: Secondary | ICD-10-CM

## 2013-07-16 MED ORDER — AMITRIPTYLINE HCL 50 MG PO TABS: ORAL_TABLET | ORAL | Status: DC

## 2013-07-16 NOTE — Telephone Encounter (Signed)
Patient request a week supply of Amitriptyline sent to [facility] until she gets it from mail order. Xiomar Crompton,CMA

## 2013-07-16 NOTE — Telephone Encounter (Signed)
[id number][facility] Good thru 07/16/13-10/14/13 ref# [id number]. Corliss SkainsJamie Timotheus Salm, CMA

## 2013-07-16 NOTE — Progress Notes (Signed)
  Subjective:    CC: Followup  HPI: Denise Rojas returns, she is a pleasant 58 year old female with right-sided C7 cervical radiculopathy. She did have an x-ray with multilevel cervical degenerative changes, we placed her through conservative measures including formal physical therapy, anti-inflammatories, muscle relaxers, she did extremely well, unfortunately she had a recurrence of her symptoms and is agreeable to proceeding with more interventional measures. Symptoms are worse at night with numbness and tingling coming down the right arm in a C7 nerve root distribution. Pain is moderate, persistent.  Past medical history, Surgical history, Family history not pertinant except as noted below, Social history, Allergies, and medications have been entered into the medical record, reviewed, and no changes needed.   Review of Systems: No fevers, chills, night sweats, weight loss, chest pain, or shortness of breath.   Objective:    General: Well Developed, well nourished, and in no acute distress.  Neuro: Alert and oriented x3, extra-ocular muscles intact, sensation grossly intact.  HEENT: Normocephalic, atraumatic, pupils equal round reactive to light, neck supple, no masses, no lymphadenopathy, thyroid nonpalpable.  Skin: Warm and dry, no rashes. Cardiac: Regular rate and rhythm, no murmurs rubs or gallops, no lower extremity edema.  Respiratory: Clear to auscultation bilaterally. Not using accessory muscles, speaking in full sentences. Neck: Negative spurling's Full neck range of motion Grip strength and sensation normal in bilateral hands Strength good C4 to T1 distribution No sensory change to C4 to T1 Reflexes normal  Impression and Recommendations:

## 2013-07-16 NOTE — Assessment & Plan Note (Signed)
Symptoms are predominantly at night, adding amitriptyline. We are going to get an MRI of the cervical spine in anticipation of a cervical epidural. Return to see me to go over MRI results.

## 2013-07-17 ENCOUNTER — Telehealth: Payer: Self-pay | Admitting: *Deleted

## 2013-07-17 NOTE — Telephone Encounter (Signed)
Excellent advice, continue with the one half tab at night.

## 2013-07-17 NOTE — Telephone Encounter (Signed)
I spoke with Denise Rojas this afternoon and she is going to try the half dose again. She understands that she needs to build up a tolerance to medication. She will call with any further complications or questions. Denise Rojas, CMA

## 2013-07-17 NOTE — Telephone Encounter (Signed)
Bonita QuinLinda called this morning stating that she took amitryptiline 25mg  last evening and upon waking this morning she said that she feels completely "exhausted and practically non-functioning". She said that her neck felt great and she did sleep well. I told her that if this was a new med for her that she may feel like that until her body adapts to this medication and that she may need to split it again and build up her tolerance to this medication. I also said that I would get back to her after speaking with you. Please advise. Corliss SkainsJamie Samad Thon, CMA

## 2013-07-23 ENCOUNTER — Ambulatory Visit (INDEPENDENT_AMBULATORY_CARE_PROVIDER_SITE_OTHER): Payer: PRIVATE HEALTH INSURANCE

## 2013-07-23 DIAGNOSIS — M5412 Radiculopathy, cervical region: Secondary | ICD-10-CM

## 2013-07-23 DIAGNOSIS — M4802 Spinal stenosis, cervical region: Secondary | ICD-10-CM

## 2013-07-23 DIAGNOSIS — IMO0002 Reserved for concepts with insufficient information to code with codable children: Secondary | ICD-10-CM

## 2013-07-30 ENCOUNTER — Telehealth: Payer: Self-pay

## 2013-07-30 NOTE — Telephone Encounter (Signed)
I informed pt of her MRI and she has an appointment with Dr. Benjamin Stainhekkekandam  In the morning July 30 to go over her MRI result and to come up with a game plan./Bernabe Dorce,CMA

## 2013-07-30 NOTE — Telephone Encounter (Signed)
Error

## 2013-07-31 ENCOUNTER — Encounter: Payer: Self-pay | Admitting: Sports Medicine

## 2013-07-31 ENCOUNTER — Ambulatory Visit (INDEPENDENT_AMBULATORY_CARE_PROVIDER_SITE_OTHER): Payer: PRIVATE HEALTH INSURANCE | Admitting: Sports Medicine

## 2013-07-31 VITALS — BP 129/75 | HR 71 | Ht 64.0 in | Wt 175.0 lb

## 2013-07-31 DIAGNOSIS — M5412 Radiculopathy, cervical region: Secondary | ICD-10-CM

## 2013-07-31 MED ORDER — AMITRIPTYLINE HCL 10 MG PO TABS: 10.0000 mg | ORAL_TABLET | Freq: Three times a day (TID) | ORAL | Status: DC | PRN

## 2013-07-31 NOTE — Assessment & Plan Note (Signed)
MRI shows C4-5, C5-6, and C6-C7 cervical degenerative disc disease with cervical spinal stenosis at its worst at the C5-C6 level. At this point we are going to proceed with a right C7 interlaminar epidural injection. We are also going to decrease her amitriptyline dose to one half of a 10 mg pill up to 3 times a day. Return to see me in about 2-3 weeks after the injection to evaluate response.

## 2013-07-31 NOTE — Progress Notes (Signed)
  Subjective:    CC: Followup  HPI: This is a pleasant 58 year old female with right worse than left C7 radiculopathy. We recently obtained an MRI after she failed physical therapy, steroids, NSAIDs, muscle relaxers. She did try amitriptyline which caused some sedation.  Past medical history, Surgical history, Family history not pertinant except as noted below, Social history, Allergies, and medications have been entered into the medical record, reviewed, and no changes needed.   Review of Systems: No fevers, chills, night sweats, weight loss, chest pain, or shortness of breath.   Objective:    General: Well Developed, well nourished, and in no acute distress.  Neuro: Alert and oriented x3, extra-ocular muscles intact, sensation grossly intact.  HEENT: Normocephalic, atraumatic, pupils equal round reactive to light, neck supple, no masses, no lymphadenopathy, thyroid nonpalpable.  Skin: Warm and dry, no rashes. Cardiac: Regular rate and rhythm, no murmurs rubs or gallops, no lower extremity edema.  Respiratory: Clear to auscultation bilaterally. Not using accessory muscles, speaking in full sentences.  MRI shows multilevel degenerative disc disease worse at C4-5, C5-6, and C6-7 levels, with moderate central canal stenosis at the C5-C6 level  Impression and Recommendations:

## 2013-08-01 ENCOUNTER — Ambulatory Visit
Admission: RE | Admit: 2013-08-01 | Discharge: 2013-08-01 | Disposition: A | Discharge status: Home / Self Care | Payer: PRIVATE HEALTH INSURANCE | Source: Ambulatory Visit | Attending: Sports Medicine | Admitting: Sports Medicine

## 2013-08-01 VITALS — BP 161/98

## 2013-08-01 DIAGNOSIS — M5412 Radiculopathy, cervical region: Secondary | ICD-10-CM

## 2013-08-01 MED ORDER — IOHEXOL 300 MG/ML  SOLN
1.0000 mL | Freq: Once | INTRAMUSCULAR | Status: AC | PRN
  Administered 2013-08-01: 1 mL via INTRAVENOUS

## 2013-08-01 MED ORDER — TRIAMCINOLONE ACETONIDE 40 MG/ML IJ SUSP (RADIOLOGY)
60.0000 mg | Freq: Once | INTRAMUSCULAR | Status: AC
Start: 2013-08-01 — End: 2013-08-01
  Administered 2013-08-01: 60 mg via EPIDURAL

## 2013-08-01 NOTE — Discharge Instructions (Signed)

## 2013-08-14 ENCOUNTER — Ambulatory Visit (INDEPENDENT_AMBULATORY_CARE_PROVIDER_SITE_OTHER): Payer: PRIVATE HEALTH INSURANCE | Admitting: Sports Medicine

## 2013-08-14 ENCOUNTER — Encounter: Payer: Self-pay | Admitting: Sports Medicine

## 2013-08-14 VITALS — BP 137/78 | HR 61 | Ht 64.0 in | Wt 172.0 lb

## 2013-08-14 DIAGNOSIS — M5412 Radiculopathy, cervical region: Secondary | ICD-10-CM

## 2013-08-14 NOTE — Progress Notes (Signed)
  Subjective:    CC: Followup after epidural  HPI: This is a very pleasant 58 year old female with right worse than left C7 radiculopathy, she failed physical therapy, steroids, NSAIDs, muscle relaxers, ultimately we obtained an MRI that showed multilevel degenerative changes as expected, subsequently she had a right-sided C6-C7 interlaminar epidural and returns with all radicular symptoms and the vast majority of her pain resolved. She is using amitriptyline 5 mg every other day, anything more often and this creates some drowsiness, this is appropriate.  Past medical history, Surgical history, Family history not pertinant except as noted below, Social history, Allergies, and medications have been entered into the medical record, reviewed, and no changes needed.   Review of Systems: No fevers, chills, night sweats, weight loss, chest pain, or shortness of breath.   Objective:    General: Well Developed, well nourished, and in no acute distress.  Neuro: Alert and oriented x3, extra-ocular muscles intact, sensation grossly intact.  HEENT: Normocephalic, atraumatic, pupils equal round reactive to light, neck supple, no masses, no lymphadenopathy, thyroid nonpalpable.  Skin: Warm and dry, no rashes. Cardiac: Regular rate and rhythm, no murmurs rubs or gallops, no lower extremity edema.  Respiratory: Clear to auscultation bilaterally. Not using accessory muscles, speaking in full sentences. Neck: Negative spurling's Full neck range of motion Grip strength and sensation normal in bilateral hands Strength good C4 to T1 distribution No sensory change to C4 to T1 Reflexes normal Impression and Recommendations:

## 2013-08-14 NOTE — Assessment & Plan Note (Signed)
All radicular symptoms have resolved after a C6-C7 right-sided interlaminar epidural. She's also doing approximately 5 mg of amitriptyline every other day, anything more than this makes her drowsy. At this point she can return to see me on an as-needed basis.

## 2013-09-11 ENCOUNTER — Telehealth: Payer: Self-pay | Admitting: *Deleted

## 2013-09-11 DIAGNOSIS — M5412 Radiculopathy, cervical region: Secondary | ICD-10-CM

## 2013-09-11 NOTE — Telephone Encounter (Signed)
Pt lvm asking about a prescription she did not indicate what med she needed.  I called her back and lvm for return call.Denise Rojas

## 2013-09-12 MED ORDER — AMITRIPTYLINE HCL 10 MG PO TABS: 5.0000 mg | ORAL_TABLET | ORAL | Status: DC

## 2013-09-12 MED ORDER — FLUOXETINE HCL 10 MG PO CAPS: ORAL_CAPSULE | ORAL | Status: DC

## 2013-09-12 NOTE — Telephone Encounter (Signed)
Spoke w/pt and she wanted refills sent to her mail order.Denise Rojas Aspers

## 2013-09-16 ENCOUNTER — Other Ambulatory Visit: Payer: Self-pay | Admitting: Sports Medicine

## 2014-01-27 ENCOUNTER — Encounter: Payer: Self-pay | Admitting: Family Medicine

## 2014-01-27 ENCOUNTER — Ambulatory Visit (INDEPENDENT_AMBULATORY_CARE_PROVIDER_SITE_OTHER): Payer: PRIVATE HEALTH INSURANCE | Admitting: Family Medicine

## 2014-01-27 VITALS — BP 138/81 | HR 66 | Ht 64.0 in | Wt 177.0 lb

## 2014-01-27 DIAGNOSIS — R635 Abnormal weight gain: Secondary | ICD-10-CM

## 2014-01-27 DIAGNOSIS — S46811A Strain of other muscles, fascia and tendons at shoulder and upper arm level, right arm, initial encounter: Secondary | ICD-10-CM

## 2014-01-27 DIAGNOSIS — M5412 Radiculopathy, cervical region: Secondary | ICD-10-CM

## 2014-01-27 DIAGNOSIS — Z0000 Encounter for general adult medical examination without abnormal findings: Secondary | ICD-10-CM

## 2014-01-27 DIAGNOSIS — E785 Hyperlipidemia, unspecified: Secondary | ICD-10-CM

## 2014-01-27 DIAGNOSIS — Z1231 Encounter for screening mammogram for malignant neoplasm of breast: Secondary | ICD-10-CM

## 2014-01-27 MED ORDER — PHENTERMINE HCL 37.5 MG PO CAPS: 37.5000 mg | ORAL_CAPSULE | ORAL | Status: DC

## 2014-01-27 MED ORDER — MELOXICAM 15 MG PO TABS: ORAL_TABLET | ORAL | Status: DC

## 2014-01-27 MED ORDER — MELOXICAM 15 MG PO TABS
ORAL_TABLET | ORAL | Status: DC
Start: 2014-01-27 — End: 2014-06-10

## 2014-01-27 MED ORDER — AMITRIPTYLINE HCL 10 MG PO TABS: 5.0000 mg | ORAL_TABLET | ORAL | Status: DC

## 2014-01-27 MED ORDER — FLUOXETINE HCL 10 MG PO CAPS: ORAL_CAPSULE | ORAL | Status: DC

## 2014-01-27 MED ORDER — METAXALONE 400 MG PO TABS: 400.0000 mg | ORAL_TABLET | Freq: Three times a day (TID) | ORAL | Status: DC

## 2014-01-27 MED ORDER — SIMVASTATIN 40 MG PO TABS: 40.0000 mg | ORAL_TABLET | Freq: Every evening | ORAL | Status: DC

## 2014-01-27 NOTE — Addendum Note (Signed)
Addended by: Deno EtienneBARKLEY, Rosaleigh Brazzel L on: 01/27/2014 04:35 PM   Modules accepted: Orders, Medications

## 2014-01-27 NOTE — Patient Instructions (Signed)
Keep up a regular exercise program and make sure you are eating a healthy diet Try to eat 4 servings of dairy a day, or if you are lactose intolerant take a calcium with vitamin D daily.  Your vaccines are up to date.  Recommend the My Fitness Pal app. It is free to download on your smart phone.

## 2014-01-27 NOTE — Progress Notes (Signed)
Subjective:     Denise Rojas is a 59 y.o. female and is here for a comprehensive physical exam. The patient reports problems - Right shoulder pain-she didn't do physical therapy for her neck and says that that is actually much better. She's no longer getting numbness and pain down her right arm.. She still having a lot of discomfort over the trapezius. Occasionally radiates down to the mid thoracic area and up to the neck. She's been using over-the-counter patches as well as doing her stretches that she was given a physical therapy. She also uses a TENS unit occasionally and it does give her temporary relief. 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  History   Social History  . Marital Status: Married    Spouse Name: Fayrene Fearing    Number of Children: N/A  . Years of Education: N/A   Occupational History  . Not on file.   Social History Main Topics  . Smoking status: Never Smoker   . Smokeless tobacco: Not on file  . Alcohol Use: Yes  . Drug Use: No  . Sexual Activity: Yes   Other Topics Concern  . Not on file   Social History Narrative   Some regular exercise. 1 adopted children.    Health Maintenance  Topic Date Due  . HIV Screening  08/01/1969  . INFLUENZA VACCINE  08/02/2013  . MAMMOGRAM  06/05/2014  . COLONOSCOPY  07/12/2014  . TETANUS/TDAP  03/08/2021    The following portions of the patient's history were reviewed and updated as appropriate: allergies, current medications, past family history, past medical history, past social history, past surgical history and problem list.  Review of Systems A comprehensive review of systems was negative.   Objective:    BP 138/81 mmHg  Pulse 66  Ht  (1.626 m)  Wt 177 lb (80.287 kg)  BMI 30.37 kg/m2  SpO2 97% General appearance: alert, cooperative and appears stated age Head: Normocephalic, without obvious abnormality, atraumatic Eyes: conj normal, PEERLA, EOMi Ears: normal TM's and external ear  canals both ears Nose: Nares normal. Septum midline. Mucosa normal. No drainage or sinus tenderness. Throat: lips, mucosa, and tongue normal; teeth and gums normal Neck: no adenopathy, no carotid bruit, no JVD, supple, symmetrical, trachea midline and thyroid not enlarged, symmetric, no tenderness/mass/nodules Back: symmetric, no curvature. ROM normal. No CVA tenderness. Lungs: clear to auscultation bilaterally Breasts: normal appearance, no masses or tenderness Heart: regular rate and rhythm, S1, S2 normal, no murmur, click, rub or gallop Abdomen: soft, non-tender; bowel sounds normal; no masses,  no organomegaly Extremities: extremities normal, atraumatic, no cyanosis or edema Pulses: 2+ and symmetric Skin: Skin color, texture, turgor normal. No rashes or lesions Lymph nodes: Cervical, supraclavicular, and axillary nodes normal. Neurologic: Alert and oriented X 3, normal strength and tone. Normal symmetric reflexes. Normal coordination and gait    Right shoulder with normal range of motion. She is tender and hospital but not over the trapezius muscle.   Assessment:    Healthy female exam.      Plan:     See After Visit Summary for Counseling Recommendations  Keep up a regular exercise program and make sure you are eating a healthy diet Try to eat 4 servings of dairy a day, or if you are lactose intolerant take a calcium with vitamin D daily.  Your vaccines are up to date.   Abnormal weight gain/BMI 30 - Says she is really frustrated. Has  tried cutting back and even skipping meals. Though she si snot exercising regularly though feels she is active. Goal of 150lb. discussed the importance of having an exercise strategy we also discussed the importance of eating regularly and not undereating or overeating. Recommended the smart phone application called my fitness pal to help direct her calorie goals. Or she could certainly look into something like Weight Watchers which has proven to be  effective. Discussed that she went numb been monitored monthly. Recommend follow-up in one month with me for blood pressure and weight check and to make sure that she's getting on track with diet and exercise.  Trapezius strain-continue with home exercises while the trauma was relaxer. She is Re: Complete physical therapy for her neck. Encouraged her to consider massage therapy but she says she's had it checked with her insurance and it will not be covered. Could consider trigger point injections if not improving.

## 2014-02-04 ENCOUNTER — Ambulatory Visit (INDEPENDENT_AMBULATORY_CARE_PROVIDER_SITE_OTHER): Payer: Commercial Managed Care - PPO

## 2014-02-04 DIAGNOSIS — Z1231 Encounter for screening mammogram for malignant neoplasm of breast: Secondary | ICD-10-CM

## 2014-02-05 LAB — BASIC METABOLIC PANEL WITH GFR
BUN: 17 mg/dL (ref 6–23)
CO2: 28 mEq/L (ref 19–32)
CREATININE: 0.9 mg/dL (ref 0.50–1.10)
Calcium: 10.5 mg/dL (ref 8.4–10.5)
Chloride: 99 mEq/L (ref 96–112)
GFR, EST NON AFRICAN AMERICAN: 70 mL/min
GFR, Est African American: 81 mL/min
GLUCOSE: 87 mg/dL (ref 70–99)
POTASSIUM: 4.3 meq/L (ref 3.5–5.3)
SODIUM: 138 meq/L (ref 135–145)

## 2014-02-05 LAB — LIPID PANEL
Cholesterol: 239 mg/dL — ABNORMAL HIGH (ref 0–200)
HDL: 43 mg/dL (ref >39)
LDL CALC: 169 mg/dL — AB (ref 0–99)
TRIGLYCERIDES: 133 mg/dL (ref <150)
Total CHOL/HDL Ratio: 5.6 Ratio
VLDL: 27 mg/dL (ref 0–40)

## 2014-03-10 ENCOUNTER — Encounter: Payer: Self-pay | Admitting: Family Medicine

## 2014-03-10 ENCOUNTER — Ambulatory Visit (INDEPENDENT_AMBULATORY_CARE_PROVIDER_SITE_OTHER): Payer: Commercial Managed Care - PPO | Admitting: Family Medicine

## 2014-03-10 VITALS — BP 136/81 | HR 70 | Ht 64.0 in | Wt 164.0 lb

## 2014-03-10 DIAGNOSIS — Z6828 Body mass index (BMI) 28.0-28.9, adult: Secondary | ICD-10-CM

## 2014-03-10 DIAGNOSIS — R635 Abnormal weight gain: Secondary | ICD-10-CM

## 2014-03-10 MED ORDER — PHENTERMINE HCL 37.5 MG PO CAPS: 37.5000 mg | ORAL_CAPSULE | ORAL | Status: DC

## 2014-03-10 NOTE — Progress Notes (Signed)
   Subjective:    Patient ID: Denise Rojas, female    DOB: 03-30-1955, 59 y.o.   MRN: [id number]  HPI Follow-up for weight loss. Patient was started on phentermine about 5 weeks ago. She's taking 37.5 mg capsule daily. She is actually lost 13 pounds in the last 5 weeks.  She is working out and doing yard work. She feels like she has more energy. Not affecting her sleep. No CP or palpitations. She is counting calories.  She would like to get to 150lbs. She has been pushing away from the table. She feels like moving and being more on the go.    Review of Systems     Objective:   Physical Exam  Constitutional: She is oriented to person, place, and time. She appears well-developed and well-nourished.  HENT:  Head: Normocephalic and atraumatic.  Cardiovascular: Normal rate, regular rhythm and normal heart sounds.   Pulmonary/Chest: Effort normal and breath sounds normal.  Neurological: She is alert and oriented to person, place, and time.  Skin: Skin is warm and dry.  Psychiatric: She has a normal mood and affect. Her behavior is normal.          Assessment & Plan:  Abnormal weight gain/BMI of 28-she has done fantastic and has lost 13 pounds in the last 5 weeks. She is asymptomatic on the medication. Her blood pressure is well-controlled today. Will refill medication for one more month. Ultimately she would like to get down to 150 pounds. Again reminded her about the importance of diet and exercise and being consistent with this so that when she does, the medication she will not gain weight back. Follow-up in one month for nurse blood pressure and weight check.  Time spent 15 min, > 50% spent cousneling about weight loss.

## 2014-04-10 ENCOUNTER — Ambulatory Visit (INDEPENDENT_AMBULATORY_CARE_PROVIDER_SITE_OTHER): Payer: Commercial Managed Care - PPO | Admitting: Family Medicine

## 2014-04-10 VITALS — BP 130/78 | HR 66 | Ht 64.0 in | Wt 158.0 lb

## 2014-04-10 DIAGNOSIS — R635 Abnormal weight gain: Secondary | ICD-10-CM

## 2014-04-10 MED ORDER — PHENTERMINE HCL 37.5 MG PO CAPS: 37.5000 mg | ORAL_CAPSULE | ORAL | Status: DC

## 2014-04-10 NOTE — Progress Notes (Signed)
   Subjective:    Patient ID: Denise Rojas, female    DOB: 1955-09-24, 59 y.o.   MRN: [id number]  HPI Patient is here for blood pressure and weight check. Denies any trouble sleeping, palpitations, or any other medication problems. Patient states she has been working out and making some dietary changes.   Review of Systems     Objective:   Physical Exam        Assessment & Plan:  Patient has lost weight. A refill for Phentermine will be sent to patient preferred pharmacy. Patient advised to schedule a four week nurse visit and keep her upcoming appointment with her PCP. Verbalized understanding, no further questions.

## 2014-04-10 NOTE — Progress Notes (Signed)
Weight loss success, refill in Andrea's inbox.

## 2014-05-08 ENCOUNTER — Ambulatory Visit (INDEPENDENT_AMBULATORY_CARE_PROVIDER_SITE_OTHER): Payer: Commercial Managed Care - PPO | Admitting: Physician Assistant

## 2014-05-08 VITALS — BP 142/87 | HR 72 | Ht 64.0 in | Wt 153.0 lb

## 2014-05-08 DIAGNOSIS — R635 Abnormal weight gain: Secondary | ICD-10-CM

## 2014-05-08 MED ORDER — PHENTERMINE HCL 37.5 MG PO CAPS: 37.5000 mg | ORAL_CAPSULE | ORAL | Status: DC

## 2014-05-08 NOTE — Progress Notes (Signed)
   Subjective:    Patient ID: Denise Rojas, female    DOB: 09-03-1955, 59 y.o.   MRN: [id number]  HPI  Patient is here for blood pressure and weight check. Denies any trouble sleeping, palpitations, or any other medication problems.   Review of Systems     Objective:   Physical Exam        Assessment & Plan:  Patient has lost weight. A refill for Phentermine will be sent to patient preferred pharmacy. Patient advised to schedule a four week nurse visit and keep her upcoming appointment with her PCP. Verbalized understanding, no further questions.

## 2014-06-05 ENCOUNTER — Ambulatory Visit (INDEPENDENT_AMBULATORY_CARE_PROVIDER_SITE_OTHER): Payer: Commercial Managed Care - PPO | Admitting: Family Medicine

## 2014-06-05 VITALS — BP 134/85 | HR 78 | Wt 151.0 lb

## 2014-06-05 DIAGNOSIS — R635 Abnormal weight gain: Secondary | ICD-10-CM | POA: Diagnosis not present

## 2014-06-05 MED ORDER — PHENTERMINE HCL 37.5 MG PO CAPS: 37.5000 mg | ORAL_CAPSULE | ORAL | Status: DC

## 2014-06-05 NOTE — Progress Notes (Signed)
   Subjective:    Patient ID: Denise Rojas, female    DOB: November 22, 1955, 59 y.o.   MRN: [id number]  HPI  Patient is here for blood pressure and weight check. Denies any trouble sleeping, palpitations, or any other medication problems. Has made no different changes regarding diet/exercise since last visit.  Review of Systems     Objective:   Physical Exam        Assessment & Plan:  Patient has lost some weight. A refill for Phentermine will be sent to provider for review. Patient advised to schedule a four week nurse visit and keep her upcoming appointment with her PCP. Verbalized understanding, no further questions.  She is down 2 more pounds and now at a BMI of 25. Great work.  Nani Gasseratherine Metheney, MD

## 2014-06-10 ENCOUNTER — Other Ambulatory Visit: Payer: Self-pay | Admitting: *Deleted

## 2014-06-10 DIAGNOSIS — M5412 Radiculopathy, cervical region: Secondary | ICD-10-CM

## 2014-06-10 MED ORDER — MELOXICAM 15 MG PO TABS: ORAL_TABLET | ORAL | Status: DC

## 2014-07-30 ENCOUNTER — Other Ambulatory Visit: Payer: Self-pay | Admitting: Family Medicine

## 2014-08-13 ENCOUNTER — Other Ambulatory Visit: Payer: Self-pay | Admitting: Family Medicine

## 2014-09-19 ENCOUNTER — Other Ambulatory Visit: Payer: Self-pay | Admitting: Family Medicine

## 2014-10-29 ENCOUNTER — Other Ambulatory Visit: Payer: Self-pay | Admitting: Family Medicine

## 2014-11-11 ENCOUNTER — Other Ambulatory Visit: Payer: Self-pay | Admitting: Family Medicine

## 2014-11-11 MED ORDER — SIMVASTATIN 40 MG PO TABS: ORAL_TABLET | ORAL | Status: DC

## 2014-11-11 MED ORDER — SIMVASTATIN 40 MG PO TABS: 40.0000 mg | ORAL_TABLET | Freq: Every day | ORAL | Status: DC

## 2014-12-02 ENCOUNTER — Encounter: Payer: Self-pay | Admitting: Family Medicine

## 2014-12-02 ENCOUNTER — Ambulatory Visit (INDEPENDENT_AMBULATORY_CARE_PROVIDER_SITE_OTHER): Payer: Commercial Managed Care - PPO | Admitting: Family Medicine

## 2014-12-02 VITALS — BP 159/84 | HR 56 | Temp 98.0°F | Resp 16 | Wt 158.2 lb

## 2014-12-02 DIAGNOSIS — M79643 Pain in unspecified hand: Secondary | ICD-10-CM | POA: Diagnosis not present

## 2014-12-02 DIAGNOSIS — M7989 Other specified soft tissue disorders: Secondary | ICD-10-CM

## 2014-12-02 DIAGNOSIS — S46819A Strain of other muscles, fascia and tendons at shoulder and upper arm level, unspecified arm, initial encounter: Secondary | ICD-10-CM | POA: Diagnosis not present

## 2014-12-02 DIAGNOSIS — E785 Hyperlipidemia, unspecified: Secondary | ICD-10-CM

## 2014-12-02 DIAGNOSIS — M2550 Pain in unspecified joint: Secondary | ICD-10-CM

## 2014-12-02 DIAGNOSIS — Z23 Encounter for immunization: Secondary | ICD-10-CM

## 2014-12-02 LAB — CBC
HEMATOCRIT: 45.2 % (ref 36.0–46.0)
Hemoglobin: 15 g/dL (ref 12.0–15.0)
MCH: 30.2 pg (ref 26.0–34.0)
MCHC: 33.2 g/dL (ref 30.0–36.0)
MCV: 91.1 fL (ref 78.0–100.0)
MPV: 9 fL (ref 8.6–12.4)
PLATELETS: 224 10*3/uL (ref 150–400)
RBC: 4.96 MIL/uL (ref 3.87–5.11)
RDW: 14 % (ref 11.5–15.5)
WBC: 3.2 10*3/uL — AB (ref 4.0–10.5)

## 2014-12-02 LAB — COMPLETE METABOLIC PANEL WITH GFR
ALT: 20 U/L (ref 6–29)
AST: 25 U/L (ref 10–35)
Albumin: 4.3 g/dL (ref 3.6–5.1)
Alkaline Phosphatase: 47 U/L (ref 33–130)
BUN: 16 mg/dL (ref 7–25)
CHLORIDE: 102 mmol/L (ref 98–110)
CO2: 28 mmol/L (ref 20–31)
CREATININE: 0.73 mg/dL (ref 0.50–0.99)
Calcium: 9.6 mg/dL (ref 8.6–10.4)
GLUCOSE: 84 mg/dL (ref 65–99)
Potassium: 4.8 mmol/L (ref 3.5–5.3)
SODIUM: 141 mmol/L (ref 135–146)
TOTAL PROTEIN: 7.1 g/dL (ref 6.1–8.1)
Total Bilirubin: 0.8 mg/dL (ref 0.2–1.2)

## 2014-12-02 LAB — LIPID PANEL
Cholesterol: 211 mg/dL — ABNORMAL HIGH (ref 125–200)
HDL: 55 mg/dL (ref >=46)
LDL CALC: 134 mg/dL — AB (ref <130)
TRIGLYCERIDES: 111 mg/dL (ref <150)
Total CHOL/HDL Ratio: 3.8 Ratio (ref <=5.0)
VLDL: 22 mg/dL (ref <30)

## 2014-12-02 LAB — C-REACTIVE PROTEIN

## 2014-12-02 LAB — URIC ACID: Uric Acid, Serum: 3.7 mg/dL (ref 2.4–7.0)

## 2014-12-02 MED ORDER — SIMVASTATIN 40 MG PO TABS: 40.0000 mg | ORAL_TABLET | Freq: Every day | ORAL | Status: DC

## 2014-12-02 MED ORDER — CELECOXIB 200 MG PO CAPS: 200.0000 mg | ORAL_CAPSULE | Freq: Two times a day (BID) | ORAL | Status: DC | PRN

## 2014-12-02 MED ORDER — METAXALONE 400 MG PO TABS: 400.0000 mg | ORAL_TABLET | Freq: Three times a day (TID) | ORAL | Status: DC

## 2014-12-02 NOTE — Progress Notes (Signed)
   Subjective:    Patient ID: Denise DuckingLinda Glotfelty, female    DOB: September 10, 1955, 59 y.o.   MRN: [id number]  HPI Bilateral trapezius pain x 1 month.  No injury or trauma. Using salon pas. Plans on starting to  Use her TENS unit.  She has started the upper back exercises.   She's had this similar pain before. She can usually work through it.  Bilateral hand pain with throbbing and stiffness.  Says occ her hands will swell. Swelling more during daytime. worse at night.  Mother with rheumatoid. No swelling or pain in the wrist.  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 Taking Mobic as needed.    hyperlipidemia-doing well on simvastatin. No side affects or myalgias. She is due for refilfor mail order. When we did her last set of blood wok she had actually been off the medication or a week.  Review of Systems     Objective:   Physical Exam  Constitutional: She is oriented to person, place, and time. She appears well-developed and well-nourished.  HENT:  Head: Normocephalic and atraumatic.  Eyes: Conjunctivae and EOM are normal.  Cardiovascular: Normal rate.   Pulmonary/Chest: Effort normal.  Musculoskeletal:  She does have some tension and tightness in both upper trapezius muscles. Neck with normal range of motion. Wrists and hands with normal range of motion. No apparent swelling of the joints or generalized hands. No atrophy of the muscles noted. No significant deformity of the finger joints. No erythema or increased warmth. Strength is 5 out of 5 in all fingers.  Neurological: She is alert and oriented to person, place, and time.  Skin: Skin is dry. No pallor.  Psychiatric: She has a normal mood and affect. Her behavior is normal.  Vitals reviewed.         Assessment & Plan:  Trapezius strain - encourage her to get back into her stretches, heating pad, TENS unit. She's not improving over the next 3  Weeks then please let us know and can refer for more formal physical  therapy.  Bilateral hand pain/swelling -  I think most likely just overuse injury , but with her family history of rheumatoid arthritis we'll go ahead and do lab work for neurologic workup. She had a similar workup a couple of years ago that was negtive at that time. Se also had x-rays at that time that were fairly normal and really did not show a lot of rthritis. We'll hold off on repeating x-rays today per her preference. Also recommend that she take her anti-inflammatory daily. She right now she is just taking it as needed but I think if she were to take it daily for the next 2-3 weeks to really hi the inflammation hard this may really help and provide her some relief. I really don't see any significant edema of th hands today and she really describes general edema andnot actual joint edem.  Hyperlipidemia - due to recheck lipids now that she is back on her statin. Refill sent to mail order pharmacy for 6 months will check liver function as well.  Given shingles vaccine today.

## 2014-12-03 LAB — SEDIMENTATION RATE: SED RATE: 4 mm/h (ref 0–30)

## 2014-12-03 LAB — ANA: ANA: POSITIVE — AB

## 2014-12-03 LAB — ANTI-NUCLEAR AB-TITER (ANA TITER): ANA Titer 1: 1:80 {titer} — ABNORMAL HIGH

## 2014-12-03 LAB — CYCLIC CITRUL PEPTIDE ANTIBODY, IGG: Cyclic Citrullin Peptide Ab: <16 Units

## 2014-12-30 ENCOUNTER — Telehealth: Payer: Self-pay

## 2014-12-30 MED ORDER — SIMVASTATIN 40 MG PO TABS: 40.0000 mg | ORAL_TABLET | Freq: Every day | ORAL | Status: DC

## 2014-12-30 NOTE — Telephone Encounter (Signed)
Patient called requesting simvastatin rx be printed so she can shop pharmacy.  She currently does not have insurance.  Rx printed.  Patient aware.

## 2015-03-29 ENCOUNTER — Telehealth: Payer: Self-pay | Admitting: *Deleted

## 2015-03-29 MED ORDER — FLUOXETINE HCL 10 MG PO CAPS: ORAL_CAPSULE | ORAL | Status: DC

## 2015-03-29 NOTE — Telephone Encounter (Signed)
Pt would like refill for prozac sent to Denise Rojas.Loralee PacasBarkley, Aubria Vanecek VesperLynetta

## 2015-03-30 ENCOUNTER — Other Ambulatory Visit: Payer: Self-pay

## 2015-03-30 MED ORDER — SIMVASTATIN 40 MG PO TABS: 40.0000 mg | ORAL_TABLET | Freq: Every day | ORAL | Status: DC

## 2015-07-01 ENCOUNTER — Other Ambulatory Visit: Payer: Self-pay | Admitting: *Deleted

## 2015-07-01 MED ORDER — MELOXICAM 15 MG PO TABS: ORAL_TABLET | ORAL | Status: DC

## 2015-07-26 ENCOUNTER — Emergency Department
Admission: EM | Admit: 2015-07-26 | Discharge: 2015-07-26 | Disposition: A | Discharge status: Home / Self Care | Payer: Self-pay | Source: Home / Self Care | Attending: Family Medicine | Admitting: Family Medicine

## 2015-07-26 DIAGNOSIS — L089 Local infection of the skin and subcutaneous tissue, unspecified: Secondary | ICD-10-CM

## 2015-07-26 DIAGNOSIS — L729 Follicular cyst of the skin and subcutaneous tissue, unspecified: Secondary | ICD-10-CM

## 2015-07-26 DIAGNOSIS — L02211 Cutaneous abscess of abdominal wall: Secondary | ICD-10-CM

## 2015-07-26 MED ORDER — CLINDAMYCIN HCL 300 MG PO CAPS: 300.0000 mg | ORAL_CAPSULE | Freq: Four times a day (QID) | ORAL | 0 refills | Status: DC

## 2015-07-26 MED ORDER — IBUPROFEN 600 MG PO TABS: 600.0000 mg | ORAL_TABLET | Freq: Once | ORAL | Status: DC

## 2015-07-26 MED ORDER — IBUPROFEN 400 MG PO TABS
400.0000 mg | ORAL_TABLET | Freq: Once | ORAL | Status: AC
  Administered 2015-07-26: 400 mg via ORAL

## 2015-07-26 MED ORDER — HYDROCODONE-ACETAMINOPHEN 5-325 MG PO TABS: 1.0000 | ORAL_TABLET | ORAL | 0 refills | Status: DC | PRN
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
## 2015-07-28 ENCOUNTER — Emergency Department (INDEPENDENT_AMBULATORY_CARE_PROVIDER_SITE_OTHER)
Admission: EM | Admit: 2015-07-28 | Discharge: 2015-07-28 | Disposition: A | Discharge status: Home / Self Care | Payer: Self-pay | Source: Home / Self Care | Attending: Family Medicine | Admitting: Family Medicine

## 2015-07-28 ENCOUNTER — Encounter: Payer: Self-pay | Admitting: *Deleted

## 2015-07-28 DIAGNOSIS — Z5189 Encounter for other specified aftercare: Secondary | ICD-10-CM

## 2015-07-28 NOTE — Discharge Instructions (Signed)
Continue regular dressing changes.  Continue antibiotic.

## 2015-07-28 NOTE — ED Triage Notes (Signed)
Denise Rojas is here for a recheck of her RUQ abd abscess.

## 2015-07-28 NOTE — ED Provider Notes (Signed)
[facility]    CSN: [id number] Arrival date & time: 07/28/15  0803  First Provider Contact:  First MD Initiated Contact with Patient 07/28/15 0820        History   Chief Complaint Chief Complaint  Patient presents with  . Wound Check    HPI Denise Rojas is a 60 y.o. female.   Patient returns for re-check of infected cyst right abdomen.  She states that she continues to have drainage from her wound, but it has been decreasing.  No fevers, chills, and sweats.  Her pain has decreased.  She notes that she had some irritation from tape and has started using an Ace wrap to secure her bandage in place      Past Medical History:  Diagnosis Date  . Allergy   . Depression   . Hyperlipidemia     Patient Active Problem List   Diagnosis Date Noted  . Right C7 cervical radiculopathy 03/27/2013  . Carpal tunnel syndrome, bilateral 08/13/2012  . Essential hypertension, benign 05/18/2011  . ALLERGIC RHINITIS 06/05/2008  . POLYARTHRITIS 06/05/2008  . SCIATICA 05/28/2007  . TROCHANTERIC BURSITIS, BILATERAL 05/28/2007  . LEUKOPENIA, CHRONIC 04/23/2007  . Hyperlipidemia 03/22/2007  . DEPRESSION 03/22/2007  . 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 OSTEOPENIA 03/09/2004    Past Surgical History:  Procedure Laterality Date  . TOTAL ABDOMINAL HYSTERECTOMY      OB History    No data available       Home Medications    Prior to Admission medications   Medication Sig Start Date End Date Taking? Authorizing Provider  AMBULATORY NON FORMULARY MEDICATION Medication Name: Isotonix b complex    Historical Provider, MD  AMBULATORY NON FORMULARY MEDICATION Medication Name: prime joint supplement    Historical Provider, MD  Ascorbic Acid (VITAMIN C) 1000 MG tablet Take 1,000 mg by mouth daily.    Historical Provider, MD  BIOTIN 5000 PO Take by mouth.    Historical Provider, MD  CALCIUM CITRATE-VITAMIN D PO Take by mouth.    Historical Provider,  MD  celecoxib (CELEBREX) 200 MG capsule Take 1 capsule (200 mg total) by mouth 2 (two) times daily as needed. 12/02/14   Agapito Games, MD  clindamycin (CLEOCIN) 300 MG capsule Take 1 capsule (300 mg total) by mouth 4 (four) times daily. X 7 days 07/26/15   Junius Finner, PA-C  FLUoxetine (PROZAC) 10 MG capsule Take 1 capsule by mouth  once daily 03/29/15   Agapito Games, MD  HYDROcodone-acetaminophen (NORCO/VICODIN) 5-325 MG tablet Take 1-2 tablets by mouth every 4 (four) hours as needed. 07/26/15   Junius Finner, PA-C  Magnesium Oxide -Mg Supplement 400 MG CAPS Take by mouth.      Historical Provider, MD  meloxicam (MOBIC) 15 MG tablet Take 1 tablet by mouth  daily as needed for pain 07/01/15   Agapito Games, MD  metaxalone (SKELAXIN) 400 MG tablet Take 1 tablet (400 mg total) by mouth 3 (three) times daily. 12/02/14   Agapito Games, MD  naproxen sodium (ANAPROX) 220 MG tablet Take 220 mg by mouth 2 (two) times daily with a meal.    Historical Provider, MD  Omega-3 Krill Oil 500 MG CAPS Take by mouth.    Historical Provider, MD  Potassium 95 MG TBCR Take by mouth.      Historical Provider, MD  simvastatin (ZOCOR) 40 MG tablet Take 1 tablet (40 mg total) by mouth at bedtime. 03/30/15  Agapito Games, MD  vitamin E 400 UNIT capsule Take 400 Units by mouth daily.    Historical Provider, MD    Family History Family History  Problem Relation Age of Onset  . Liver cancer Mother   . Heart attack    . Stroke Father 6    Social History Social History  Substance Use Topics  . Smoking status: Never Smoker  . Smokeless tobacco: Never Used  . Alcohol use Yes     Allergies   Atorvastatin   Review of Systems Review of Systems  Constitutional: Negative for activity change, chills, diaphoresis, fatigue and fever.  Skin: Positive for color change and wound.  All other systems reviewed and are negative.    Physical Exam Triage Vital Signs ED Triage Vitals    Enc Vitals Group     BP 07/28/15 0821 165/82     Pulse Rate 07/28/15 0821 60     Resp 07/28/15 0821 16     Temp 07/28/15 0821 97.7 F (36.5 C)     Temp Source 07/28/15 0821 Oral     SpO2 07/28/15 0821 98 %     Weight 07/28/15 0821 162 lb (73.5 kg)     Height 07/28/15 0821 5\' 5"  (1.651 m)     Head Circumference --      Peak Flow --      Pain Score 07/28/15 0822 0     Pain Loc --      Pain Edu? --      Excl. in GC? --    No data found.   Updated Vital Signs BP 165/82 (BP Location: Left Arm)   Pulse 60   Temp 97.7 F (36.5 C) (Oral)   Resp 16   Ht 5\' 5"  (1.651 m)   Wt 162 lb (73.5 kg)   SpO2 98%   BMI 26.96 kg/m        Physical Exam  Constitutional: She appears well-developed and well-nourished. No distress.  Eyes: Pupils are equal, round, and reactive to light.  Cardiovascular: Regular rhythm.   Pulmonary/Chest: Effort normal.  Abdominal: Soft. There is no tenderness.    Abscess site right abdomen has minimal purulent drainage present, and minimal surrounding tenderness to palpation.  No swelling present.  There is some mild macular erythema at each side of wound where tape had been present.  Musculoskeletal: She exhibits no edema.  Skin: Skin is warm and dry.  Nursing note and vitals reviewed.    UC Treatments / Results  Labs (all labs ordered are listed, but only abnormal results are displayed) Labs Reviewed  WOUND CULTURE          Procedures Procedures none    Initial Impression / Assessment and Plan / UC Course  I have reviewed the triage vital signs and the nursing notes.  Pertinent labs & imaging results that were available during my care of the patient were reviewed by me and considered in my medical decision making (see chart for details).  Clinical Course       Final Clinical Impressions(s) / UC Diagnoses   Final diagnoses:  Wound check, abscess   Cellulitis appears to be improving.  As a precaution, will obtain wound culture.   Patient appears to have some minor erythema on left and right sides of wound  from tape adhesive.  Redressed wound with Mepelex Border 7.5 by 7.5cm, and secured with ace wrap.  Recommend that she obtain and use hypoallergenic tape. Advised to continue clindamycin. Continue  Ibuprofen 200mg , 4 tabs every 8 hours with food, as needed. Note mildly elevated BP today; suspect a consequence of pain.  Recommend she monitor until wound healed. May follow-up here or with PCP in 48 hours if desired.  New Prescriptions New Prescriptions   No medications on file     Lattie Haw, MD 07/28/15 [id number]

## 2015-07-31 ENCOUNTER — Telehealth: Payer: Self-pay | Admitting: Emergency Medicine

## 2015-07-31 LAB — WOUND CULTURE
Gram Stain: NONE SEEN
ORGANISM ID, BACTERIA: NO GROWTH

## 2015-10-02 ENCOUNTER — Other Ambulatory Visit: Payer: Self-pay | Admitting: Family Medicine

## 2015-10-12 ENCOUNTER — Telehealth: Payer: Self-pay | Admitting: Family Medicine

## 2015-10-12 NOTE — Telephone Encounter (Signed)
Ok to fill med for 3 more months.

## 2015-10-12 NOTE — Telephone Encounter (Signed)
I had called pt to let her know she is due for her f/u with Dr. Linford ArnoldMetheney on her Mood and Hyperliperdemia and patient wants Dr. Linford ArnoldMetheney to know that she is trying to find helath insurance and states she still needs her meds called in but wants Metheney to know what is going on.

## 2015-10-13 ENCOUNTER — Other Ambulatory Visit: Payer: Self-pay

## 2015-10-13 MED ORDER — SIMVASTATIN 40 MG PO TABS: 40.0000 mg | ORAL_TABLET | Freq: Every day | ORAL | 0 refills | Status: DC

## 2015-10-13 MED ORDER — FLUOXETINE HCL 10 MG PO CAPS: 10.0000 mg | ORAL_CAPSULE | Freq: Every day | ORAL | 0 refills | Status: DC

## 2015-10-13 NOTE — Telephone Encounter (Signed)
REFILL'S SENT  

## 2015-11-05 ENCOUNTER — Other Ambulatory Visit: Payer: Self-pay | Admitting: Family Medicine

## 2015-11-08 MED ORDER — MELOXICAM 15 MG PO TABS: ORAL_TABLET | ORAL | 0 refills | Status: DC

## 2016-01-07 ENCOUNTER — Encounter: Payer: Self-pay | Admitting: Family Medicine

## 2016-01-07 ENCOUNTER — Ambulatory Visit (INDEPENDENT_AMBULATORY_CARE_PROVIDER_SITE_OTHER): Payer: BLUE CROSS/BLUE SHIELD | Admitting: Family Medicine

## 2016-01-07 VITALS — BP 138/86 | Ht 65.0 in

## 2016-01-07 DIAGNOSIS — E785 Hyperlipidemia, unspecified: Secondary | ICD-10-CM | POA: Diagnosis not present

## 2016-01-07 DIAGNOSIS — M8580 Other specified disorders of bone density and structure, unspecified site: Secondary | ICD-10-CM | POA: Diagnosis not present

## 2016-01-07 DIAGNOSIS — F339 Major depressive disorder, recurrent, unspecified: Secondary | ICD-10-CM

## 2016-01-07 DIAGNOSIS — I10 Essential (primary) hypertension: Secondary | ICD-10-CM

## 2016-01-07 DIAGNOSIS — Z1211 Encounter for screening for malignant neoplasm of colon: Secondary | ICD-10-CM

## 2016-01-07 MED ORDER — FLUOXETINE HCL 10 MG PO CAPS: 10.0000 mg | ORAL_CAPSULE | Freq: Every day | ORAL | 1 refills | Status: DC

## 2016-01-07 MED ORDER — SIMVASTATIN 40 MG PO TABS: 40.0000 mg | ORAL_TABLET | Freq: Every day | ORAL | 3 refills | Status: DC

## 2016-01-07 MED ORDER — MELOXICAM 15 MG PO TABS
ORAL_TABLET | ORAL | 3 refills | Status: DC
Start: 2016-01-07 — End: 2016-07-27

## 2016-01-07 NOTE — Progress Notes (Signed)
Subjective:    CC: Multiple issues and concerns today.  HPI:  She has 3 specific request today. She would like to be scheduled for a colonoscopy. She plans on getting her mammogram through her [facility]. They're having a medical unit calm. She'll try to get that report when available. And she would like to go ahead and get her bone density test rescheduled. She had one years ago and it was slightly abnormal and would like to have that done again.  Hypertension-she says her blood pressures because she has been under a lot of stress for the last year. Her husband has been without a job and she has had to work extra. 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 He now has a job and has been there for about 4 weeks if she is starting to fill her stress levels come back down. She said she also been eating more because when she gets stressed she tends to eat. She says she plans to get back on track with diet and regular exercise.  Follow-up depression-currently on fluoxetine. She does complain of feeling down and depressed several days of the week and overeating. She's also had negative thoughts about herself but no thoughts of self-harm.  She says this past year she was treated for a bladder infection, she had a cyst burst on the skin on her abdomen and had to go to UC.  Obesity-she plans on getting back on track with exercise. She started to do some stretches and plans on getting back into yoga. She like to get back down to 158 pounds.  Past medical history, Surgical history, Family history not pertinant except as noted below, Social history, Allergies, and medications have been entered into the medical record, reviewed, and corrections made.   Review of Systems: No fevers, chills, night sweats, weight loss, chest pain, or shortness of breath.   Objective:    General: Well Developed, well nourished, and in no acute distress.  Neuro: Alert and oriented x3, extra-ocular muscles  intact, sensation grossly intact.  HEENT: Normocephalic, atraumatic  Skin: Warm and dry, no rashes. Cardiac: Regular rate and rhythm, no murmurs rubs or gallops, no lower extremity edema.  Respiratory: Clear to auscultation bilaterally. Not using accessory muscles, speaking in full sentences.   Impression and Recommendations:   Hypertension-blood pressure not at goal today. Discussed continuing to work on lifestyle changes and follow-up in 3 months for repeat pressure.   Depression-PHQ 9 score of 9 today. Discussed options. Will continue with current regimen. She is very happy with it and does not want to change. She says really she is hopeful that now that her husband has a job and a lot of financial stress has been taken off of her that she will get better the next couple of months.  Hyperlipidemia-due to refill simvastatin. Due for repeat lipids.  She will get her mammogram through her church. Encouraged her to get that report when available.  Colon cancer screening-we'll refer for GI referral.  Due for bone density test. We'll schedule.

## 2016-01-11 ENCOUNTER — Other Ambulatory Visit: Payer: Self-pay | Admitting: Family Medicine

## 2016-01-11 ENCOUNTER — Ambulatory Visit (INDEPENDENT_AMBULATORY_CARE_PROVIDER_SITE_OTHER): Payer: Managed Care, Other (non HMO)

## 2016-01-11 ENCOUNTER — Other Ambulatory Visit (HOSPITAL_BASED_OUTPATIENT_CLINIC_OR_DEPARTMENT_OTHER): Payer: Managed Care, Other (non HMO)

## 2016-01-11 DIAGNOSIS — Z780 Asymptomatic menopausal state: Secondary | ICD-10-CM

## 2016-01-11 DIAGNOSIS — M85852 Other specified disorders of bone density and structure, left thigh: Secondary | ICD-10-CM | POA: Diagnosis not present

## 2016-01-25 LAB — COMPLETE METABOLIC PANEL WITH GFR
ALBUMIN: 4.5 g/dL (ref 3.6–5.1)
ALK PHOS: 37 U/L (ref 33–130)
ALT: 16 U/L (ref 6–29)
AST: 24 U/L (ref 10–35)
BILIRUBIN TOTAL: 0.5 mg/dL (ref 0.2–1.2)
BUN: 26 mg/dL — ABNORMAL HIGH (ref 7–25)
CALCIUM: 10.4 mg/dL (ref 8.6–10.4)
CO2: 28 mmol/L (ref 20–31)
Chloride: 100 mmol/L (ref 98–110)
Creat: 1.03 mg/dL — ABNORMAL HIGH (ref 0.50–0.99)
GFR, EST AFRICAN AMERICAN: 68 mL/min (ref >=60)
GFR, EST NON AFRICAN AMERICAN: 59 mL/min — AB (ref >=60)
Glucose, Bld: 91 mg/dL (ref 65–99)
POTASSIUM: 4.3 mmol/L (ref 3.5–5.3)
SODIUM: 135 mmol/L (ref 135–146)
Total Protein: 7.3 g/dL (ref 6.1–8.1)

## 2016-01-25 LAB — LIPID PANEL
CHOL/HDL RATIO: 4.9 ratio (ref <5.0)
Cholesterol: 230 mg/dL — ABNORMAL HIGH (ref <200)
HDL: 47 mg/dL — AB (ref >50)
LDL Cholesterol: 144 mg/dL — ABNORMAL HIGH (ref <100)
TRIGLYCERIDES: 196 mg/dL — AB (ref <150)
VLDL: 39 mg/dL — ABNORMAL HIGH (ref <30)

## 2016-01-26 MED ORDER — ATORVASTATIN CALCIUM 40 MG PO TABS: 40.0000 mg | ORAL_TABLET | Freq: Every day | ORAL | 3 refills | Status: DC

## 2016-01-26 NOTE — Addendum Note (Signed)
Addended by: Nani GasserMETHENEY, Kyisha Fowle D on: 01/26/2016 06:08 PM   Modules accepted: Orders

## 2016-03-06 LAB — HM COLONOSCOPY

## 2016-03-12 ENCOUNTER — Encounter: Payer: Self-pay | Admitting: Family Medicine

## 2016-04-06 ENCOUNTER — Ambulatory Visit: Payer: Self-pay | Admitting: Family Medicine

## 2016-05-05 ENCOUNTER — Other Ambulatory Visit: Payer: Self-pay | Admitting: Family Medicine

## 2016-05-05 DIAGNOSIS — F339 Major depressive disorder, recurrent, unspecified: Secondary | ICD-10-CM

## 2016-05-08 ENCOUNTER — Other Ambulatory Visit: Payer: Self-pay | Admitting: *Deleted

## 2016-05-08 DIAGNOSIS — F339 Major depressive disorder, recurrent, unspecified: Secondary | ICD-10-CM

## 2016-05-08 MED ORDER — FLUOXETINE HCL 10 MG PO CAPS: 10.0000 mg | ORAL_CAPSULE | Freq: Every day | ORAL | 0 refills | Status: DC

## 2016-06-04 IMAGING — MR MR CERVICAL SPINE W/O CM
5 of 6 series · 32 of 48 positions shown · non-contrast
Comparison: Radiographs dated 03/27/2013

CLINICAL DATA: Neck pain and right arm pain and numbness. Right C7
cervical radiculopathy.

EXAM:
MRI CERVICAL SPINE WITHOUT CONTRAST
TECHNIQUE: Multiplanar, multisequence MR imaging of the cervical spine was
performed. No intravenous contrast was administered.

[Series 2: T2 · sagittal · 3.0mm · 0.56mm/px · 7 of 15 slices shown (1 of 3)]
[im 1/15]
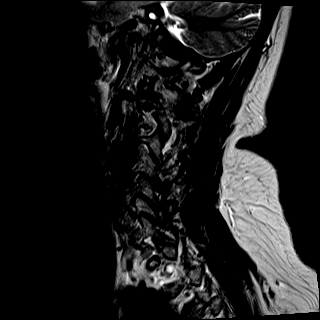
[im 3/15]
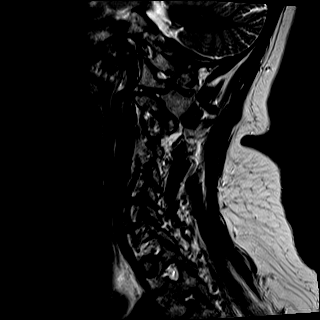
[im 5/15]
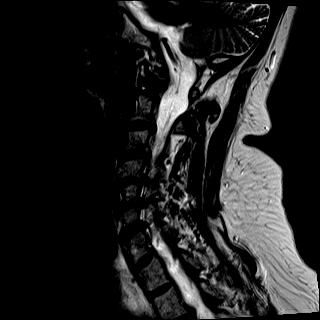
[im 8/15]
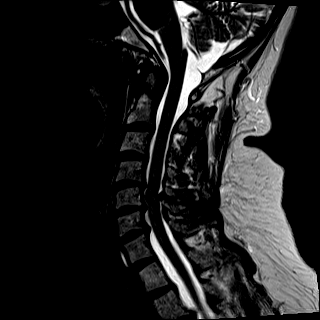
[im 10/15]
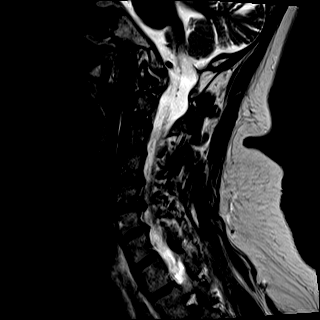
[im 12/15]
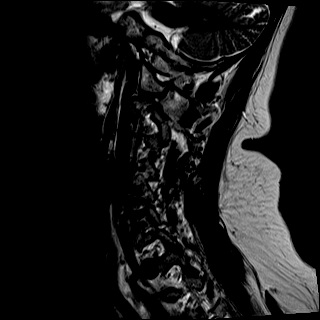
[im 15/15]
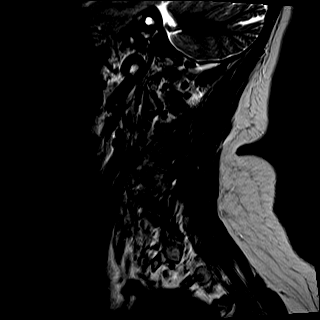

[Series 5: T2 · axial · 3.0mm · 0.62mm/px · z∈[-76,+31]mm · 9 of 26 slices shown (2 of 3)]
[im 1/26]
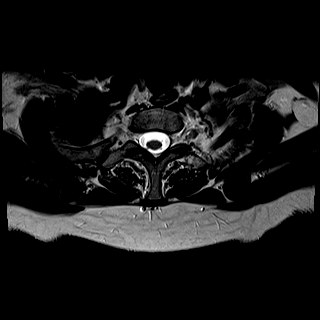
[im 5/26]
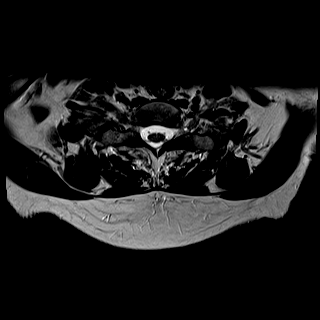
[im 7/26]
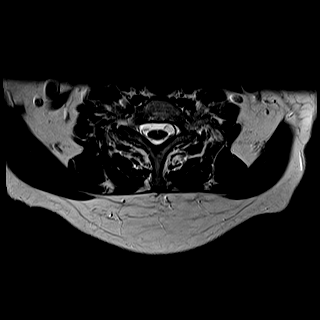
[im 12/26]
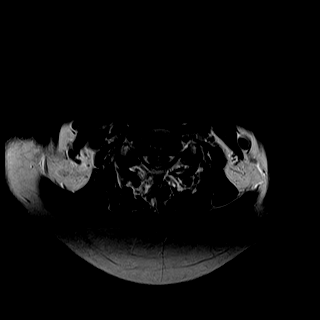
[im 14/26]
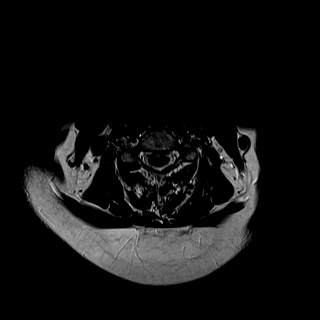
[im 19/26]
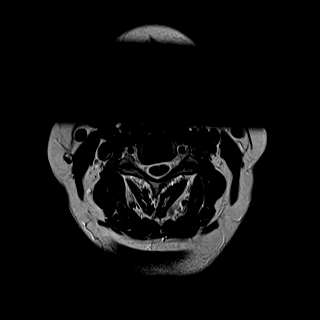
[im 21/26]
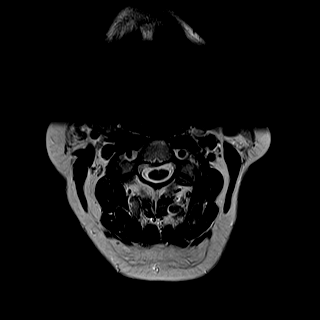
[im 23/26]
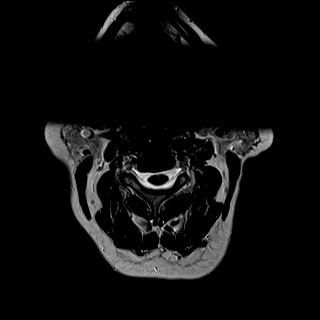
[im 26/26]
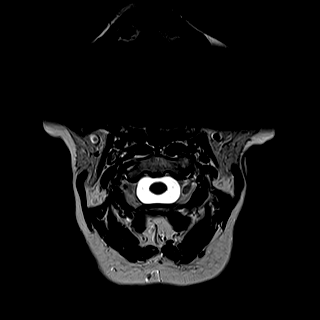

[Series 7: STIR · sagittal · 3.0mm · 0.35mm/px · 4 of 15 slices shown]
[im 1/15]
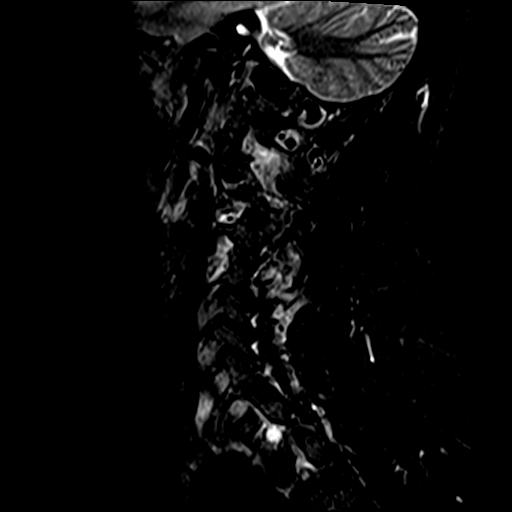
[im 3/15]
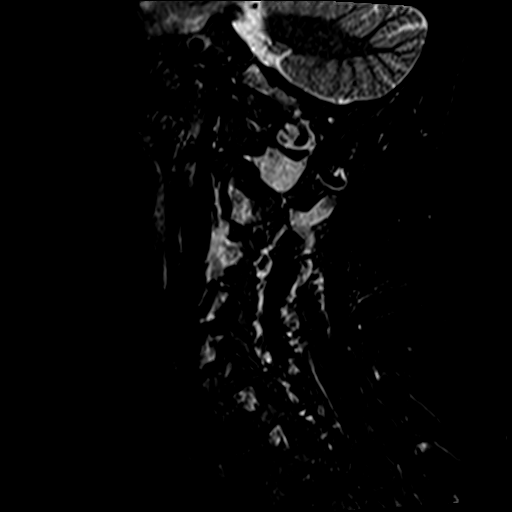
[im 6/15]
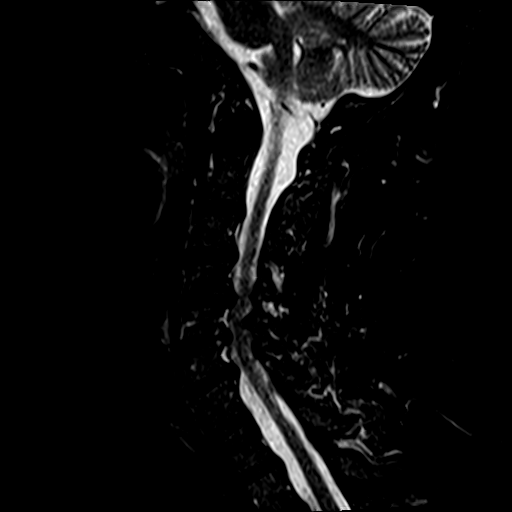
[im 9/15]
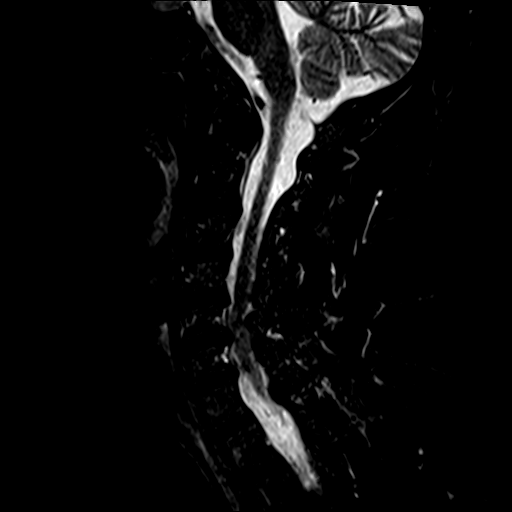

[Series 100: T1 · sagittal · 3.0mm · 0.56mm/px · 6 of 15 slices shown]
[im 1/15]
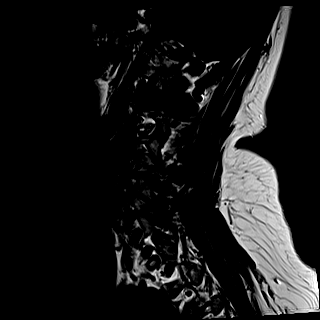
[im 3/15]
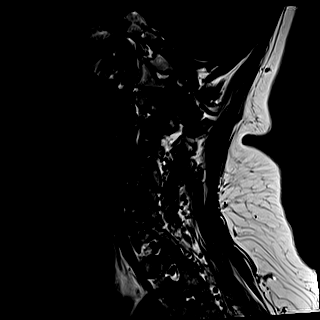
[im 6/15]
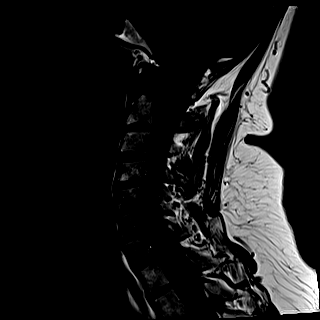
[im 9/15]
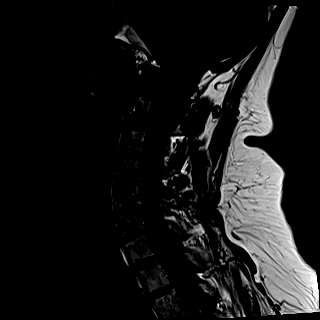
[im 12/15]
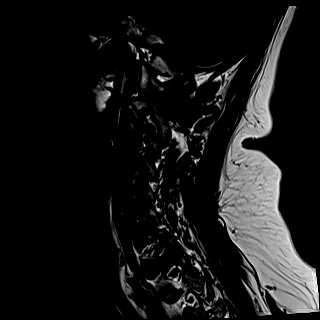
[im 15/15]
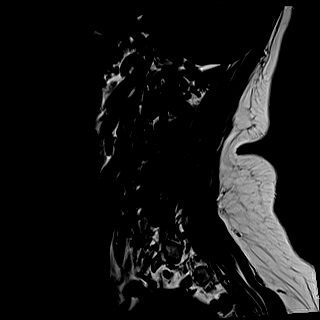

[Series 101: T2 · sagittal · 3.0mm · 0.35mm/px · 6 of 15 slices shown (3 of 3)]
[im 1/15]
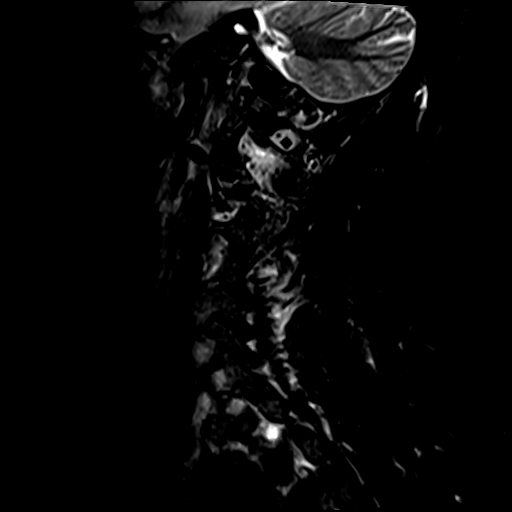
[im 3/15]
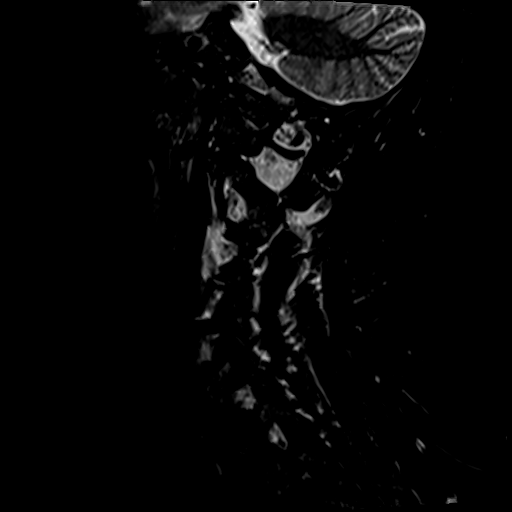
[im 6/15]
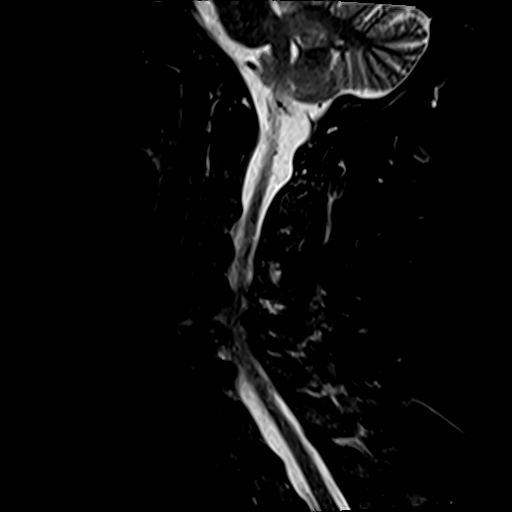
[im 9/15]
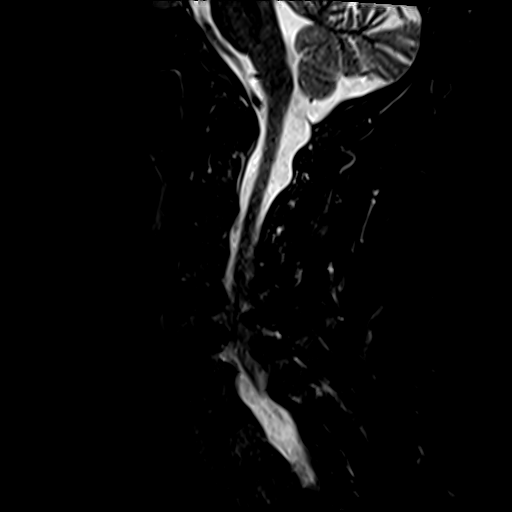
[im 12/15]
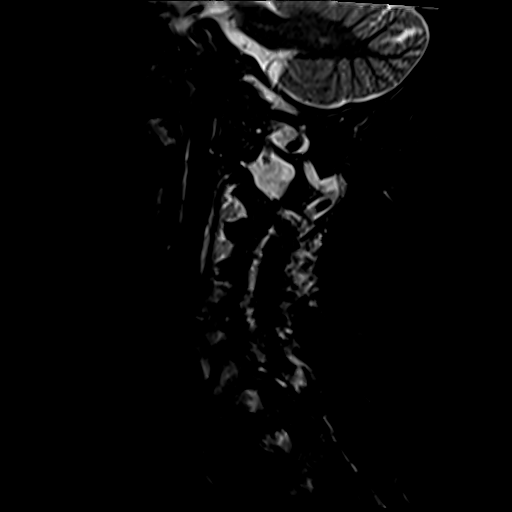
[im 15/15]
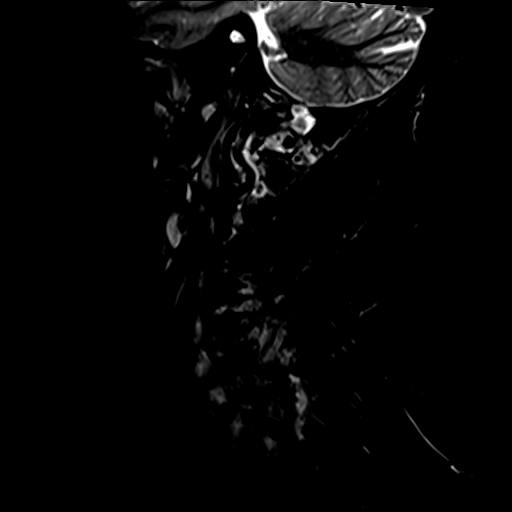

[32 of 48 positions shown; findings below may reference images not displayed]

FINDINGS: The visualized intracranial contents and paraspinal soft tissues are
normal.

C1-2 and C2-3:  Normal.

C3-4:  Minimal uncinate spurs to the right with no impingement.

C4-5: Disc space narrowing with prominent osteophytes protruding
into the right lateral recess and right neural foramen which should
affect the right C5 nerve.

C5-6: Prominent osteophytes protrude into both neural foramina and
create moderate foraminal stenosis which could affect either or both
C6 nerves.

C6-7: Small broad-based disc osteophyte complex asymmetric to the
right with bilateral foraminal narrowing, slightly more prominent on
the right than the left. Either these could affect the C7 nerves.

C7-T1:  Normal.
IMPRESSION: 1. Right lateral recess and foraminal impingement at C4-5.
2. Bilateral foraminal stenosis at C5-6 and C6-7.

## 2016-07-27 ENCOUNTER — Other Ambulatory Visit: Payer: Self-pay | Admitting: Family Medicine

## 2016-09-05 ENCOUNTER — Other Ambulatory Visit: Payer: Self-pay | Admitting: *Deleted

## 2016-09-05 ENCOUNTER — Ambulatory Visit (INDEPENDENT_AMBULATORY_CARE_PROVIDER_SITE_OTHER): Payer: No Typology Code available for payment source

## 2016-09-05 ENCOUNTER — Encounter: Payer: Self-pay | Admitting: Family Medicine

## 2016-09-05 ENCOUNTER — Ambulatory Visit (INDEPENDENT_AMBULATORY_CARE_PROVIDER_SITE_OTHER): Payer: No Typology Code available for payment source | Admitting: Family Medicine

## 2016-09-05 VITALS — BP 139/80 | HR 70 | Temp 98.3°F | Ht 65.0 in | Wt 165.0 lb

## 2016-09-05 DIAGNOSIS — I10 Essential (primary) hypertension: Secondary | ICD-10-CM | POA: Diagnosis not present

## 2016-09-05 DIAGNOSIS — R319 Hematuria, unspecified: Secondary | ICD-10-CM

## 2016-09-05 DIAGNOSIS — R300 Dysuria: Secondary | ICD-10-CM

## 2016-09-05 DIAGNOSIS — R899 Unspecified abnormal finding in specimens from other organs, systems and tissues: Secondary | ICD-10-CM | POA: Diagnosis not present

## 2016-09-05 DIAGNOSIS — N760 Acute vaginitis: Secondary | ICD-10-CM

## 2016-09-05 DIAGNOSIS — R059 Cough, unspecified: Secondary | ICD-10-CM

## 2016-09-05 DIAGNOSIS — R05 Cough: Secondary | ICD-10-CM

## 2016-09-05 DIAGNOSIS — R42 Dizziness and giddiness: Secondary | ICD-10-CM | POA: Diagnosis not present

## 2016-09-05 LAB — WET PREP, GENITAL
Clue Cells Wet Prep HPF POC: NONE SEEN
Trich, Wet Prep: NONE SEEN

## 2016-09-05 LAB — CBC WITH DIFFERENTIAL/PLATELET
BASOS ABS: 35 {cells}/uL (ref 0–200)
BASOS PCT: 1 %
EOS ABS: 70 {cells}/uL (ref 15–500)
Eosinophils Relative: 2 %
HEMATOCRIT: 48.3 % — AB (ref 35.0–45.0)
HEMOGLOBIN: 15.9 g/dL — AB (ref 11.7–15.5)
Lymphocytes Relative: 26 %
Lymphs Abs: 910 cells/uL (ref 850–3900)
MCH: 30.9 pg (ref 27.0–33.0)
MCHC: 32.9 g/dL (ref 32.0–36.0)
MCV: 93.8 fL (ref 80.0–100.0)
MONO ABS: 490 {cells}/uL (ref 200–950)
MPV: 8.8 fL (ref 7.5–12.5)
Monocytes Relative: 14 %
NEUTROS ABS: 1995 {cells}/uL (ref 1500–7800)
Neutrophils Relative %: 57 %
Platelets: 193 10*3/uL (ref 140–400)
RBC: 5.15 MIL/uL — ABNORMAL HIGH (ref 3.80–5.10)
RDW: 14.4 % (ref 11.0–15.0)
WBC: 3.5 10*3/uL — ABNORMAL LOW (ref 3.8–10.8)

## 2016-09-05 LAB — POCT URINALYSIS DIPSTICK
BILIRUBIN UA: NEGATIVE
GLUCOSE UA: NEGATIVE
KETONES UA: NEGATIVE
LEUKOCYTES UA: NEGATIVE
NITRITE UA: NEGATIVE
Spec Grav, UA: 1.015 (ref 1.010–1.025)
Urobilinogen, UA: 0.2 E.U./dL
pH, UA: 6 (ref 5.0–8.0)

## 2016-09-05 MED ORDER — MELOXICAM 15 MG PO TABS: 15.0000 mg | ORAL_TABLET | Freq: Every day | ORAL | 0 refills | Status: DC

## 2016-09-05 MED ORDER — SULFAMETHOXAZOLE-TRIMETHOPRIM 800-160 MG PO TABS: 1.0000 | ORAL_TABLET | Freq: Two times a day (BID) | ORAL | 0 refills | Status: DC

## 2016-09-05 MED ORDER — FLUCONAZOLE 150 MG PO TABS
150.0000 mg | ORAL_TABLET | Freq: Once | ORAL | 0 refills | Status: AC
Start: 2016-09-05 — End: 2016-09-05

## 2016-09-05 NOTE — Progress Notes (Signed)
Subjective:    Patient ID: Denise DuckingLinda Rojas, female    DOB: 1955-06-01, 61 y.o.   MRN: [id number]  HPI 61 yo female c/o urinary symptoms that started last Sunday, approximately 8-9 days ago. Noticed some blood in her urine.  She tried increasing her water and then urine looked brown.  Started Cystex and Vitamin C and craberry on Friday, 4 days ago.  Then over weekend urine color was normal but had an odor. Had low energy. Temp to 99.5 over the weekend.  She was sleeping more than normal.   Has felt foggy brained.  + dysuria. She also reports tha chest. Nonproductive. Also getting a little mild congestion with that. She also feels that she's been running some low-grade fevers.she's also been having some ear itching and throat itching. She been taking some allergy medication but doesn't really seem to be helping.   Review of Systems   She's also been experiencing some itching in the armpits but no rash as well as some burning and itching sensation around the outside of the vagina.  BP 139/80   Pulse 70   Temp 98.3 F (36.8 C)   Ht 5\' 5"  (1.651 m)   Wt 165 lb (74.8 kg)   SpO2 100%   BMI 27.46 kg/m     Allergies  Allergen Reactions  . Atorvastatin Other (See Comments)    Myalgias    Past Medical History:  Diagnosis Date  . Allergy   . Depression   . Hyperlipidemia     Past Surgical History:  Procedure Laterality Date  . TOTAL ABDOMINAL HYSTERECTOMY      Social History   Social History  . Marital status: Married    Spouse name: [name]  . Number of children: N/A  . Years of education: N/A   Occupational History  . Not on file.   Social History Main Topics  . Smoking status: Never Smoker  . Smokeless tobacco: Never Used  . Alcohol use Yes  . Drug use: No  . Sexual activity: Yes   Other Topics Concern  . Not on file   Social History Narrative   Some regular exercise. 1 adopted children.     Family History  Problem Relation Age of Onset  . Liver cancer Mother    . Heart attack Unknown   . Stroke Father [id number]    Outpatient Encounter Prescriptions as of 09/05/2016  Medication Sig  . Ascorbic Acid (VITAMIN C) 1000 MG tablet Take 1,000 mg by mouth daily.  Marland Kitchen. atorvastatin (LIPITOR) 40 MG tablet Take 1 tablet (40 mg total) by mouth daily.  . Biotin 1 MG CAPS Take by mouth.  . Calcium Carbonate-Vit D-Min (CALCIUM 1200 PO) Take by mouth 2 (two) times daily.  . Cholecalciferol (D3-1000 PO) Take by mouth daily.  . COLLAGEN-VITAMIN C PO Take by mouth.  . Cranberry 500 MG CAPS Take by mouth.  . Cyanocobalamin (VITAMIN B-12) 5000 MCG LOZG Take by mouth.  Marland Kitchen. FLUoxetine (PROZAC) 10 MG capsule Take 1 capsule (10 mg total) by mouth daily.  . Magnesium 400 MG CAPS Take by mouth.  . meloxicam (MOBIC) 15 MG tablet Take 1 tablet (15 mg total) by mouth daily. Due for follow up visit  . Misc Natural Products (GLUCOSAMINE CHONDROITIN MSM PO) Take by mouth.  . Omega 3-6-9 Fatty Acids (OMEGA 3-6-9 COMPLEX PO) Take by mouth.  Marland Kitchen. POTASSIUM PO Take by mouth.  . Turmeric Curcumin 500 MG CAPS Take by mouth.  . vitamin E 400  UNIT capsule Take 400 Units by mouth daily.  Marland Kitchen sulfamethoxazole-trimethoprim (BACTRIM DS,SEPTRA DS) 800-160 MG tablet Take 1 tablet by mouth 2 (two) times daily.   No facility-administered encounter medications on file as of 09/05/2016.           Objective:   Physical Exam  Constitutional: She is oriented to person, place, and time. She appears well-developed and well-nourished.  HENT:  Head: Normocephalic and atraumatic.  Right Ear: External ear normal.  Left Ear: External ear normal.  Nose: Nose normal.  Mouth/Throat: Oropharynx is clear and moist.  Right TM blocked by cerumen. Left TM and canal are clear.   Eyes: Pupils are equal, round, and reactive to light. Conjunctivae and EOM are normal.  Neck: Neck supple. No thyromegaly present.  Cardiovascular: Normal rate, regular rhythm and normal heart sounds.   Pulmonary/Chest: Effort normal and  breath sounds normal. She has no wheezes.  Lymphadenopathy:    She has no cervical adenopathy.  Neurological: She is alert and oriented to person, place, and time.  Skin: Skin is warm and dry.  Psychiatric: She has a normal mood and affect.        Assessment & Plan:  UTI - UA did show some blood and trace protein. tx with Bactrim.  Check CBC and BMP.    Cough - even though her lung exam is not abnormal I am concerned that she is feeling as weak and lightheaded as she is today so get chest x-ray for further evaluation. Check CBC/BMP again clinicallynothing alarming per se. But on her for years and she just especially doesn't seem like herself and seems more ill than I have previously seen her.  Vaginitis-we'll have her do a swab for KOH.ill call with evaluate for yeast vaginitis.

## 2016-09-05 NOTE — Addendum Note (Signed)
Addended by: Nani GasserMETHENEY, Mammie Meras D on: 09/05/2016 12:48 PM   Modules accepted: Orders

## 2016-09-06 LAB — BASIC METABOLIC PANEL WITH GFR
BUN: 17 mg/dL (ref 7–25)
CHLORIDE: 100 mmol/L (ref 98–110)
CO2: 24 mmol/L (ref 20–32)
CREATININE: 0.75 mg/dL (ref 0.50–0.99)
Calcium: 9.8 mg/dL (ref 8.6–10.4)
GFR, Est African American: >89 mL/min (ref >=60)
GFR, Est Non African American: 86 mL/min (ref >=60)
GLUCOSE: 96 mg/dL (ref 65–99)
POTASSIUM: 4.2 mmol/L (ref 3.5–5.3)
Sodium: 137 mmol/L (ref 135–146)

## 2016-09-06 LAB — URINE CULTURE

## 2016-09-07 ENCOUNTER — Telehealth: Payer: Self-pay | Admitting: *Deleted

## 2016-09-07 DIAGNOSIS — R3911 Hesitancy of micturition: Secondary | ICD-10-CM

## 2016-09-07 NOTE — Telephone Encounter (Signed)
Urology referral placed

## 2016-09-07 NOTE — Addendum Note (Signed)
Addended by: Deno EtienneBARKLEY, Kamaljit Hizer L on: 09/07/2016 07:51 AM   Modules accepted: Orders

## 2016-10-25 ENCOUNTER — Telehealth: Payer: Self-pay | Admitting: *Deleted

## 2016-10-25 DIAGNOSIS — F339 Major depressive disorder, recurrent, unspecified: Secondary | ICD-10-CM

## 2016-10-25 MED ORDER — FLUOXETINE HCL 10 MG PO CAPS: 10.0000 mg | ORAL_CAPSULE | Freq: Every day | ORAL | 0 refills | Status: DC

## 2016-10-25 NOTE — Telephone Encounter (Signed)
Pt called and stated that she is low on her antidepressant medication and would like a refill. I told her that I would send in a refill and asked that she call and make a f/u appt. She stated that her husband has a new job and will call to do this soon.Heath GoldBarkley, Jack Mineau Lynetta'

## 2016-11-30 ENCOUNTER — Ambulatory Visit: Payer: No Typology Code available for payment source | Admitting: Family Medicine

## 2016-11-30 ENCOUNTER — Ambulatory Visit (INDEPENDENT_AMBULATORY_CARE_PROVIDER_SITE_OTHER): Payer: BLUE CROSS/BLUE SHIELD | Admitting: Family Medicine

## 2016-11-30 ENCOUNTER — Encounter: Payer: Self-pay | Admitting: Family Medicine

## 2016-11-30 ENCOUNTER — Telehealth: Payer: Self-pay | Admitting: Family Medicine

## 2016-11-30 VITALS — BP 140/82 | HR 57 | Ht 65.0 in | Wt 163.0 lb

## 2016-11-30 DIAGNOSIS — M79642 Pain in left hand: Secondary | ICD-10-CM | POA: Diagnosis not present

## 2016-11-30 DIAGNOSIS — M25562 Pain in left knee: Secondary | ICD-10-CM

## 2016-11-30 DIAGNOSIS — I10 Essential (primary) hypertension: Secondary | ICD-10-CM | POA: Diagnosis not present

## 2016-11-30 DIAGNOSIS — F339 Major depressive disorder, recurrent, unspecified: Secondary | ICD-10-CM

## 2016-11-30 DIAGNOSIS — E785 Hyperlipidemia, unspecified: Secondary | ICD-10-CM | POA: Diagnosis not present

## 2016-11-30 DIAGNOSIS — M25561 Pain in right knee: Secondary | ICD-10-CM | POA: Diagnosis not present

## 2016-11-30 DIAGNOSIS — G8929 Other chronic pain: Secondary | ICD-10-CM

## 2016-11-30 DIAGNOSIS — M79641 Pain in right hand: Secondary | ICD-10-CM

## 2016-11-30 DIAGNOSIS — Z1231 Encounter for screening mammogram for malignant neoplasm of breast: Secondary | ICD-10-CM | POA: Diagnosis not present

## 2016-11-30 DIAGNOSIS — K649 Unspecified hemorrhoids: Secondary | ICD-10-CM | POA: Diagnosis not present

## 2016-11-30 MED ORDER — DICLOFENAC SODIUM 1 % TD GEL: 4.0000 g | Freq: Four times a day (QID) | TRANSDERMAL | 5 refills | Status: DC

## 2016-11-30 MED ORDER — DULOXETINE HCL 30 MG PO CPEP: 30.0000 mg | ORAL_CAPSULE | Freq: Every day | ORAL | 2 refills | Status: DC

## 2016-11-30 NOTE — Telephone Encounter (Signed)
Call pt: I am going to send in a prescription for Voltaren gel for her joints in her hands and knees.  And we tried it about 4 years ago but I like her to give it a try again.  It is now generic and much less expensive than it was 4 years ago.

## 2016-11-30 NOTE — Progress Notes (Signed)
Subjective:    CC: BP, joint pain  HPI:  Hypertension- Pt denies chest pain, SOB, dizziness, or heart palpitations.  Taking meds as directed w/o problems.  Denies medication side effects.    Hyperlipidemia - we switched to Lipitor earlier this year. She is tolerating well with no S.E.   She also complains of joint pain.  Particular in her hands knees and hips.  She has been doing some reading and wonders if Cymbalta might be a good choice for her.  She is currently on 10 mg of fluoxetine for mood.  She is also been feeling a little bit more down recently she feels like she is been more sensitive and more tearful.  For her pain regimen she typically takes Tylenol arthritis 8-hour daily sometimes 2 a day.  At night she will take Tylenol PM.  She uses a heating pad frequently and uses salon pads on her shoulders bilaterally.  Her biggest concern though is her hand pain.  Is also getting on the some spasming of the DIP joints in her fingers.  She has felt more emotional lately.  She has been much more tearful.  Felt down depressed and little interest and pleasure doing things about half the days.  She is taking fluoxetine 10 mg daily.  She also has an internal hemorrhoid that will flare from time to time.  She wants to know what to do to prevent it from flaring.  When it does flare she will usually use Preparation H.  She says sometimes her stools flip-flop between constipation and diarrhea.  She does take a lot of nutritional supplements and brought us an updated list today.  BP 140/82   Pulse (!) 57   Ht 5\' 5"  (1.651 m)   Wt 163 lb (73.9 kg)   SpO2 100%   BMI 27.12 kg/m     Allergies  Allergen Reactions  . Atorvastatin Other (See Comments)    Myalgias    Past Medical History:  Diagnosis Date  . Allergy   . Depression   . Hyperlipidemia     Past Surgical History:  Procedure Laterality Date  . TOTAL ABDOMINAL HYSTERECTOMY      Social History   Socioeconomic History  .  Marital status: Married    Spouse name: [name]  . Number of children: Not on file  . Years of education: Not on file  . Highest education level: Not on file  Social Needs  . Financial resource strain: Not on file  . Food insecurity - worry: Not on file  . Food insecurity - inability: Not on file  . Transportation needs - medical: Not on file  . Transportation needs - non-medical: Not on file  Occupational History  . Not on file  Tobacco Use  . Smoking status: Never Smoker  . Smokeless tobacco: Never Used  Substance and Sexual Activity  . Alcohol use: Yes  . Drug use: No  . Sexual activity: Yes  Other Topics Concern  . Not on file  Social History Narrative   Some regular exercise. 1 adopted children.     Family History  Problem Relation Age of Onset  . Liver cancer Mother   . Heart attack Unknown   . Stroke Father [id number]    Outpatient Encounter Medications as of 11/30/2016  Medication Sig  . Acetaminophen (ARTHRITIS PAIN RELIEF PO) Take by mouth.  . AMBULATORY NON FORMULARY MEDICATION Medication Name: Beet power  . Ascorbic Acid (VITAMIN C) 1000 MG tablet Take 1,000  mg by mouth daily.  Marland Kitchen. atorvastatin (LIPITOR) 40 MG tablet Take 1 tablet (40 mg total) by mouth daily.  . Biotin 1 MG CAPS Take by mouth.  . Calcium Carbonate-Vit D-Min (CALCIUM 1200 PO) Take by mouth 2 (two) times daily.  . Cholecalciferol (D3-1000 PO) Take by mouth daily.  . COLLAGEN-VITAMIN C PO Take by mouth.  . Cranberry 500 MG CAPS Take by mouth.  . Cyanocobalamin (VITAMIN B-12) 5000 MCG LOZG Take by mouth.  . Diphenhydramine-APAP, sleep, (ACETAMINOPHEN PM PO) Take by mouth.  . DULoxetine (CYMBALTA) 30 MG capsule Take 1 capsule (30 mg total) by mouth daily.  . Glucosamine-Chondroitin 1500-1200 MG/30ML LIQD Take by mouth.  . Magnesium 400 MG CAPS Take by mouth.  . meloxicam (MOBIC) 15 MG tablet Take 1 tablet (15 mg total) by mouth daily.  . Omega 3-6-9 Fatty Acids (OMEGA 3-6-9 COMPLEX PO) Take by mouth.  Marland Kitchen.  POTASSIUM PO Take by mouth.  . diclofenac sodium (VOLTAREN) 1 % GEL Apply 4 g topically 4 (four) times daily. OK to apply to knees and hand joints.  . [DISCONTINUED] FLUoxetine (PROZAC) 10 MG capsule Take 1 capsule (10 mg total) by mouth daily.  . [DISCONTINUED] Misc Natural Products (GLUCOSAMINE CHONDROITIN MSM PO) Take by mouth.  . [DISCONTINUED] sulfamethoxazole-trimethoprim (BACTRIM DS,SEPTRA DS) 800-160 MG tablet Take 1 tablet by mouth 2 (two) times daily.  . [DISCONTINUED] Turmeric Curcumin 500 MG CAPS Take by mouth.  . [DISCONTINUED] vitamin E 400 UNIT capsule Take 400 Units by mouth daily.   No facility-administered encounter medications on file as of 11/30/2016.        Objective:    General: Well Developed, well nourished, and in no acute distress.  Neuro: Alert and oriented x3, extra-ocular muscles intact, sensation grossly intact.  HEENT: Normocephalic, atraumatic  Skin: Warm and dry, no rashes. Cardiac: Regular rate and rhythm, no murmurs rubs or gallops, no lower extremity edema.  Respiratory: Clear to auscultation bilaterally. Not using accessory muscles, speaking in full sentences. MSK: No significant swelling edema or erythema over the hand joints.  Normal range of motion.  Impression and Recommendations:    HTN - Well controlled. Continue current regimen. Follow up in  6 months.    Hyperlipidemia - due to recheck lipoids now on lipitor.   Bilat hand Pain - Discussed options.  Since she has joint pain in multiple places were going to try switching her to Cymbalta and see if this is helpful.  Discussed with her that the expectation is to get about 30% improvement in pain relief.  Okay to continue with ibuprofen and Tylenol and salon pads for pain relief.  If she is not improving over the next couple of months and encouraged her to see 1 of our sports medicine providers. Will retry Voltaran gel. Had tried about 4 years ago. Can be apply to hands and knees.   Bilat knee  pain - see note above.    Overdue for mammogram.  Will place order.  She had lost her insurance for a period of time  Depression-PHQ 9 score of 19 which is consistent with severe depression.  Going to discontinue fluoxetine and switch to Cymbalta since it may also provide some muscular skeletal pain relief for her.  We will start with 30 mg and then I will see her back in 6 weeks and we can adjust the dose at that time.  Hemorrhoid-discussed the importance of trying to work on consistencies of stool.  Recommend starting with increased fiber  in the diet this can usually improve diarrhea but also help reduce the hardness of stools.

## 2016-11-30 NOTE — Patient Instructions (Signed)
Stop your fluoxetine.  Will start the cymbalta.

## 2016-11-30 NOTE — Telephone Encounter (Signed)
Pt advised,verbalized understanding. 

## 2016-12-01 ENCOUNTER — Telehealth: Payer: Self-pay

## 2016-12-01 MED ORDER — MELOXICAM 15 MG PO TABS: 15.0000 mg | ORAL_TABLET | Freq: Every day | ORAL | 0 refills | Status: DC

## 2016-12-01 NOTE — Telephone Encounter (Signed)
Patient called for a refill on Mobic. Please advise.

## 2016-12-01 NOTE — Telephone Encounter (Signed)
OK to fill

## 2016-12-07 LAB — COMPLETE METABOLIC PANEL WITH GFR
AG Ratio: 1.6 (calc) (ref 1.0–2.5)
ALBUMIN MSPROF: 4.1 g/dL (ref 3.6–5.1)
ALKALINE PHOSPHATASE (APISO): 44 U/L (ref 33–130)
ALT: 29 U/L (ref 6–29)
AST: 28 U/L (ref 10–35)
BILIRUBIN TOTAL: 0.5 mg/dL (ref 0.2–1.2)
BUN: 23 mg/dL (ref 7–25)
CHLORIDE: 103 mmol/L (ref 98–110)
CO2: 32 mmol/L (ref 20–32)
Calcium: 10.1 mg/dL (ref 8.6–10.4)
Creat: 0.87 mg/dL (ref 0.50–0.99)
GFR, Est African American: 83 mL/min/{1.73_m2} (ref >=60)
GFR, Est Non African American: 71 mL/min/{1.73_m2} (ref >=60)
GLUCOSE: 92 mg/dL (ref 65–99)
Globulin: 2.6 g/dL (calc) (ref 1.9–3.7)
POTASSIUM: 4.2 mmol/L (ref 3.5–5.3)
Sodium: 142 mmol/L (ref 135–146)
Total Protein: 6.7 g/dL (ref 6.1–8.1)

## 2016-12-07 LAB — LIPID PANEL
CHOLESTEROL: 184 mg/dL (ref <200)
HDL: 51 mg/dL (ref >50)
LDL CHOLESTEROL (CALC): 112 mg/dL — AB
Non-HDL Cholesterol (Calc): 133 mg/dL (calc) — ABNORMAL HIGH (ref <130)
Total CHOL/HDL Ratio: 3.6 (calc) (ref <5.0)
Triglycerides: 106 mg/dL (ref <150)

## 2016-12-13 ENCOUNTER — Ambulatory Visit (INDEPENDENT_AMBULATORY_CARE_PROVIDER_SITE_OTHER): Payer: BLUE CROSS/BLUE SHIELD

## 2016-12-13 DIAGNOSIS — R928 Other abnormal and inconclusive findings on diagnostic imaging of breast: Secondary | ICD-10-CM

## 2016-12-13 DIAGNOSIS — Z1231 Encounter for screening mammogram for malignant neoplasm of breast: Secondary | ICD-10-CM

## 2016-12-14 ENCOUNTER — Other Ambulatory Visit: Payer: Self-pay | Admitting: Family Medicine

## 2016-12-14 DIAGNOSIS — R928 Other abnormal and inconclusive findings on diagnostic imaging of breast: Secondary | ICD-10-CM

## 2016-12-19 ENCOUNTER — Ambulatory Visit
Admission: RE | Admit: 2016-12-19 | Discharge: 2016-12-19 | Disposition: A | Discharge status: Home / Self Care | Payer: BLUE CROSS/BLUE SHIELD | Source: Ambulatory Visit | Attending: Family Medicine | Admitting: Family Medicine

## 2016-12-19 DIAGNOSIS — R928 Other abnormal and inconclusive findings on diagnostic imaging of breast: Secondary | ICD-10-CM

## 2017-01-11 ENCOUNTER — Ambulatory Visit (INDEPENDENT_AMBULATORY_CARE_PROVIDER_SITE_OTHER): Payer: BLUE CROSS/BLUE SHIELD | Admitting: Family Medicine

## 2017-01-11 ENCOUNTER — Encounter: Payer: Self-pay | Admitting: Family Medicine

## 2017-01-11 VITALS — BP 150/82 | HR 58 | Ht 65.0 in | Wt 168.0 lb

## 2017-01-11 DIAGNOSIS — M25562 Pain in left knee: Secondary | ICD-10-CM

## 2017-01-11 DIAGNOSIS — M79642 Pain in left hand: Secondary | ICD-10-CM

## 2017-01-11 DIAGNOSIS — M25561 Pain in right knee: Secondary | ICD-10-CM

## 2017-01-11 DIAGNOSIS — G8929 Other chronic pain: Secondary | ICD-10-CM

## 2017-01-11 DIAGNOSIS — F339 Major depressive disorder, recurrent, unspecified: Secondary | ICD-10-CM | POA: Diagnosis not present

## 2017-01-11 DIAGNOSIS — Z6827 Body mass index (BMI) 27.0-27.9, adult: Secondary | ICD-10-CM | POA: Diagnosis not present

## 2017-01-11 DIAGNOSIS — M79641 Pain in right hand: Secondary | ICD-10-CM | POA: Diagnosis not present

## 2017-01-11 NOTE — Progress Notes (Addendum)
Subjective:    Patient ID: Denise Rojas, female    DOB: 09/05/1955, 62 y.o.   MRN: [id number]  HPI Follow-up polyarthralgia particularly of the hands and  Knees.  She uses Voltaren gel and we decided to try in addition of Cymbalta which could also help her depression.  She is also using Tylenol ibuprofen and salon pads.  She was not able to afford the Voltaren gel it was over $100.  She has been just using her Mobic as needed and using a generic form of Biofreeze in addition to the Cymbalta.  She says she is noticed a significant difference in her daily pain in her joints and has been very helpful for her.  Depression follow-up- she is doing well on Cymbalta with no side effects or problems.  She has noticed that she is occasionally taking a little nap in the afternoon but says it is not particularly bothersome or worrisome to her.  She has not noticed anything else concerning.  In addition she is now a new grandmother of a little girl named Financial controllerAspen.  This is been very exciting for her and very uplifting and positive.  She has gained a little bit of weight over the holidays but says it is definitely dietary.  She plans on getting back on track and in fact has started to eat a low-carb diet along with her husband.   Review of Systems  BP (!) 150/82   Pulse (!) 58   Ht 5\' 5"  (1.651 m)   Wt 168 lb (76.2 kg)   SpO2 100%   BMI 27.96 kg/m     Allergies  Allergen Reactions  . Atorvastatin Other (See Comments)    Myalgias    Past Medical History:  Diagnosis Date  . Allergy   . Depression   . Hyperlipidemia     Past Surgical History:  Procedure Laterality Date  . TOTAL ABDOMINAL HYSTERECTOMY      Social History   Socioeconomic History  . Marital status: Married    Spouse name: Denise Rojas  . Number of children: Not on file  . Years of education: Not on file  . Highest education level: Not on file  Social Needs  . Financial resource strain: Not on file  . Food insecurity - worry:  Not on file  . Food insecurity - inability: Not on file  . Transportation needs - medical: Not on file  . Transportation needs - non-medical: Not on file  Occupational History  . Not on file  Tobacco Use  . Smoking status: Never Smoker  . Smokeless tobacco: Never Used  Substance and Sexual Activity  . Alcohol use: Yes  . Drug use: No  . Sexual activity: Yes  Other Topics Concern  . Not on file  Social History Narrative   Some regular exercise. 1 adopted children.     Family History  Problem Relation Age of Onset  . Liver cancer Mother   . Heart attack Unknown   . Stroke Father [id number]  . Breast cancer Maternal Aunt 89    Outpatient Encounter Medications as of 01/11/2017  Medication Sig  . Acetaminophen (ARTHRITIS PAIN RELIEF PO) Take by mouth.  . AMBULATORY NON FORMULARY MEDICATION Medication Name: Beet power  . Ascorbic Acid (VITAMIN C) 1000 MG tablet Take 1,000 mg by mouth daily.  Marland Kitchen. atorvastatin (LIPITOR) 40 MG tablet Take 1 tablet (40 mg total) by mouth daily.  . Biotin 1 MG CAPS Take by mouth.  . Calcium Carbonate-Vit D-Min (  CALCIUM 1200 PO) Take by mouth 2 (two) times daily.  . Cholecalciferol (D3-1000 PO) Take by mouth daily.  . COLLAGEN-VITAMIN C PO Take by mouth.  . Cranberry 500 MG CAPS Take by mouth.  . Cyanocobalamin (VITAMIN B-12) 5000 MCG LOZG Take by mouth.  . Diphenhydramine-APAP, sleep, (ACETAMINOPHEN PM PO) Take by mouth.  . DULoxetine (CYMBALTA) 30 MG capsule Take 1 capsule (30 mg total) by mouth daily.  . Glucosamine-Chondroitin 1500-1200 MG/30ML LIQD Take by mouth.  . Magnesium 400 MG CAPS Take by mouth.  . meloxicam (MOBIC) 15 MG tablet Take 1 tablet (15 mg total) by mouth daily.  . Omega 3-6-9 Fatty Acids (OMEGA 3-6-9 COMPLEX PO) Take by mouth.  Marland Kitchen POTASSIUM PO Take by mouth.  . [DISCONTINUED] diclofenac sodium (VOLTAREN) 1 % GEL Apply 4 g topically 4 (four) times daily. OK to apply to knees and hand joints.   No facility-administered encounter  medications on file as of 01/11/2017.           Objective:   Physical Exam  Constitutional: She is oriented to person, place, and time. She appears well-developed and well-nourished.  HENT:  Head: Normocephalic and atraumatic.  Cardiovascular: Normal rate, regular rhythm and normal heart sounds.  Pulmonary/Chest: Effort normal and breath sounds normal.  Neurological: She is alert and oriented to person, place, and time.  Skin: Skin is warm and dry.  Psychiatric: She has a normal mood and affect. Her behavior is normal.        Assessment & Plan:  Follow-up polyarthralgia -much improvement with Cymbalta.  Continue as needed Mobic and Biofreeze.  We will go ahead and remove the diclofenac gel from her medication list since it was too expensive.  Follow-up depression -PHQ 9 score of 2 and gad 7 score of 1.  Fantastic improvement from last time.  Continue with 30 mg of Cymbalta for now.  Follow-up in 3-4 months.  BMI 27-she did get about 5 pounds since I last saw her.  She is now starting a low-carb diet and plans on getting back on track.  I do not really feel that this is a medication side effect but will continue to monitor.  She also wanted to update me and let me know that she did have a maternal aunt at age 81 he was recently diagnosed with breast cancer.  She is undergone a bilateral mastectomy.

## 2017-02-06 ENCOUNTER — Other Ambulatory Visit: Payer: Self-pay

## 2017-02-06 MED ORDER — ATORVASTATIN CALCIUM 40 MG PO TABS: 40.0000 mg | ORAL_TABLET | Freq: Every day | ORAL | 3 refills | Status: DC

## 2017-02-06 MED ORDER — DULOXETINE HCL 30 MG PO CPEP: 30.0000 mg | ORAL_CAPSULE | Freq: Every day | ORAL | 1 refills | Status: DC

## 2017-05-09 ENCOUNTER — Ambulatory Visit: Payer: BLUE CROSS/BLUE SHIELD | Admitting: Family Medicine

## 2017-05-10 ENCOUNTER — Ambulatory Visit: Payer: BLUE CROSS/BLUE SHIELD | Admitting: Family Medicine

## 2017-05-25 ENCOUNTER — Ambulatory Visit (INDEPENDENT_AMBULATORY_CARE_PROVIDER_SITE_OTHER): Payer: BLUE CROSS/BLUE SHIELD | Admitting: Family Medicine

## 2017-05-25 ENCOUNTER — Encounter: Payer: Self-pay | Admitting: Family Medicine

## 2017-05-25 VITALS — BP 145/68 | HR 55 | Ht 65.0 in | Wt 170.0 lb

## 2017-05-25 DIAGNOSIS — M2550 Pain in unspecified joint: Secondary | ICD-10-CM | POA: Diagnosis not present

## 2017-05-25 DIAGNOSIS — H6121 Impacted cerumen, right ear: Secondary | ICD-10-CM | POA: Diagnosis not present

## 2017-05-25 DIAGNOSIS — F339 Major depressive disorder, recurrent, unspecified: Secondary | ICD-10-CM | POA: Diagnosis not present

## 2017-05-25 DIAGNOSIS — M5412 Radiculopathy, cervical region: Secondary | ICD-10-CM | POA: Diagnosis not present

## 2017-05-25 DIAGNOSIS — I10 Essential (primary) hypertension: Secondary | ICD-10-CM

## 2017-05-25 DIAGNOSIS — J302 Other seasonal allergic rhinitis: Secondary | ICD-10-CM | POA: Diagnosis not present

## 2017-05-25 DIAGNOSIS — M722 Plantar fascial fibromatosis: Secondary | ICD-10-CM | POA: Diagnosis not present

## 2017-05-25 MED ORDER — DULOXETINE HCL 60 MG PO CPEP: 60.0000 mg | ORAL_CAPSULE | Freq: Every day | ORAL | 1 refills | Status: DC

## 2017-05-25 MED ORDER — MELOXICAM 15 MG PO TABS: 15.0000 mg | ORAL_TABLET | Freq: Every day | ORAL | 1 refills | Status: DC

## 2017-05-25 MED ORDER — DULOXETINE HCL 30 MG PO CPEP
30.0000 mg | ORAL_CAPSULE | Freq: Every day | ORAL | 1 refills | Status: DC
Start: 2017-05-25 — End: 2017-05-25

## 2017-05-25 MED ORDER — MELOXICAM 15 MG PO TABS: 15.0000 mg | ORAL_TABLET | Freq: Every day | ORAL | 0 refills | Status: DC

## 2017-05-25 MED ORDER — FLUTICASONE PROPIONATE 50 MCG/ACT NA SUSP
1.0000 | Freq: Every day | NASAL | 6 refills | Status: AC
Start: 2017-05-25 — End: ?

## 2017-05-25 NOTE — Patient Instructions (Signed)
Recommend a trial of Debrox eardrops for the ear wax impaction in your right ear.  Just follow the instructions on the bottle and repeat each week.  Can try wearing a night splint on your foot for the next month and see if this helps with your plantar fasciitis.

## 2017-05-25 NOTE — Progress Notes (Signed)
Subjective:    CC: 61-month follow-up  HPI:  Follow-up recurrent depression-on Cymbalta and doing well overall.  She says her husband is not feeling well, he has chronic pain issues, it does stress her and then she tends to stress eat..  Follow-up as Osteothritis/polyarthralgia-currently using Tylenol, meloxicam, and glucosamine/chondroitin.  We also added Cymbalta earlier this year for depression in addition to helping with chronic muscular skeletal pain.  She was unable to afford the Voltaren gel.  Hypertension- Pt denies chest pain, SOB, dizziness, or heart palpitations.  Taking meds as directed w/o problems.  Denies medication side effects.    C/O of heel pain x 4 months.  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 Hx of plantar fascitis - using ice and roller massage on her foot.   Bilateral eye itching and bilateral ear discomfort.  That her right ear is been bothering her little bit more.  She is had a little mild sinus congestion as well.  She has not tried any over-the-counter treatments.  Past medical history, Surgical history, Family history not pertinant except as noted below, Social history, Allergies, and medications have been entered into the medical record, reviewed, and corrections made.   Review of Systems: No fevers, chills, night sweats, weight loss, chest pain, or shortness of breath.   BP (!) 145/68   Pulse (!) 55   Ht  (1.651 m)   Wt 170 lb (77.1 kg)   SpO2 100%   BMI 28.29 kg/m     Allergies  Allergen Reactions  . Atorvastatin Other (See Comments)    Myalgias    Past Medical History:  Diagnosis Date  . Allergy   . Depression   . Hyperlipidemia     Past Surgical History:  Procedure Laterality Date  . TOTAL ABDOMINAL HYSTERECTOMY      Social History   Socioeconomic History  . Marital status: Married    Spouse name: Fayrene Fearing  . Number of children: Not on file  . Years of education: Not on file  . Highest education level: Not on file   Occupational History  . Not on file  Social Needs  . Financial resource strain: Not on file  . Food insecurity:    Worry: Not on file    Inability: Not on file  . Transportation needs:    Medical: Not on file    Non-medical: Not on file  Tobacco Use  . Smoking status: Never Smoker  . Smokeless tobacco: Never Used  Substance and Sexual Activity  . Alcohol use: Yes  . Drug use: No  . Sexual activity: Yes  Lifestyle  . Physical activity:    Days per week: Not on file    Minutes per session: Not on file  . Stress: Not on file  Relationships  . Social connections:    Talks on phone: Not on file    Gets together: Not on file    Attends religious service: Not on file    Active member of club or organization: Not on file    Attends meetings of clubs or organizations: Not on file    Relationship status: Not on file  . Intimate partner violence:    Fear of current or ex partner: Not on file    Emotionally abused: Not on file    Physically abused: Not on file    Forced sexual activity: Not on file  Other Topics Concern  . Not on file  Social History Narrative   Some regular  exercise. 1 adopted children.     Family History  Problem Relation Age of Onset  . Liver cancer Mother   . Heart attack Unknown   . Stroke Father 72  . Breast cancer Maternal Aunt 89    Outpatient Encounter Medications as of 05/25/2017  Medication Sig  . Acetaminophen (ARTHRITIS PAIN RELIEF PO) Take by mouth.  . AMBULATORY NON FORMULARY MEDICATION Medication Name: Beet power  . Ascorbic Acid (VITAMIN C) 1000 MG tablet Take 1,000 mg by mouth daily.  Marland Kitchen atorvastatin (LIPITOR) 40 MG tablet Take 1 tablet (40 mg total) by mouth daily.  . Biotin 1 MG CAPS Take by mouth.  . Calcium Carbonate-Vit D-Min (CALCIUM 1200 PO) Take by mouth 2 (two) times daily.  . Cholecalciferol (D3-1000 PO) Take by mouth daily.  . COLLAGEN-VITAMIN C PO Take by mouth.  . Cranberry 500 MG CAPS Take by mouth.  . Cyanocobalamin  (VITAMIN B-12) 5000 MCG LOZG Take by mouth.  . Diphenhydramine-APAP, sleep, (ACETAMINOPHEN PM PO) Take by mouth.  . DULoxetine (CYMBALTA) 60 MG capsule Take 1 capsule (60 mg total) by mouth daily.  . Glucosamine-Chondroitin 1500-1200 MG/30ML LIQD Take by mouth.  . Magnesium 400 MG CAPS Take by mouth.  . meloxicam (MOBIC) 15 MG tablet Take 1 tablet (15 mg total) by mouth daily.  . Omega 3-6-9 Fatty Acids (OMEGA 3-6-9 COMPLEX PO) Take by mouth.  Marland Kitchen POTASSIUM PO Take by mouth.  . [DISCONTINUED] DULoxetine (CYMBALTA) 30 MG capsule Take 1 capsule (30 mg total) by mouth daily.  . [DISCONTINUED] DULoxetine (CYMBALTA) 30 MG capsule Take 1 capsule (30 mg total) by mouth daily.  . [DISCONTINUED] meloxicam (MOBIC) 15 MG tablet Take 1 tablet (15 mg total) by mouth daily.  . [DISCONTINUED] meloxicam (MOBIC) 15 MG tablet Take 1 tablet (15 mg total) by mouth daily.  . fluticasone (FLONASE) 50 MCG/ACT nasal spray Place 1-2 sprays into both nostrils daily.   No facility-administered encounter medications on file as of 05/25/2017.        Objective:    General: Well Developed, well nourished, and in no acute distress.  Neuro: Alert and oriented x3, extra-ocular muscles intact, sensation grossly intact.  HEENT: Normocephalic, atraumatic, oropharynx is clear, left TM and canals clear.  Right canal is blocked by cerumen.  Oropharynx is clear.  No significant cervical lymphadenopathy. Skin: Warm and dry, no rashes. Cardiac: Regular rate and rhythm, no murmurs rubs or gallops, no lower extremity edema.  Respiratory: Clear to auscultation bilaterally. Not using accessory muscles, speaking in full sentences. MSK: Tender over the base of the right heel.  No significant erythema or swelling.  Ankle with normal range of motion.  Bruising or skin discoloration.  Impression and Recommendations:    Recurrent depression-well-controlled overall.  PHQ 9 score of 3 which is fantastic.  Discussed options we will continue  with Cymbalta.  Follow-up in 6 months.    Osteoarthritis/polyarthralgia-she is unable to get the Voltaren at an affordable price will go ahead and refill her meloxicam for the next 6 months.  Hypertension -she is not willing to take medications and feels like a lot of her blood pressure elevation is due to stress as well as stress eating.  She declines to start blood pressure medication today.  She feels like when she eats better her blood pressures under good control.  She does have a home blood pressure cuff and says she will monitor at home on her own.  Labs are up-to-date.  Right plantar fasciitis-New prob. at  this point she has been doing conservative care for about 4 months.  The next option would possibly be an injection.  She has not filled her insurance will likely cover it as she has a very high deductible so recommend night splints.  Information printed off [employer] for a $20 a night splint which is pretty reasonable.  She can also check at local pharmacies to see if they carry this.  Right cerumen impaction ear-commend a trial of Debrox drops at home for her ear.  If not helping were happy to irrigate her ear.  Allergic rhinitis with eye and ear symptoms-recommend a trial of nasal steroid spray.

## 2017-07-03 ENCOUNTER — Encounter: Payer: Self-pay | Admitting: Family Medicine

## 2017-07-03 ENCOUNTER — Ambulatory Visit (INDEPENDENT_AMBULATORY_CARE_PROVIDER_SITE_OTHER): Payer: BLUE CROSS/BLUE SHIELD | Admitting: Family Medicine

## 2017-07-03 VITALS — BP 126/73 | HR 70 | Ht 65.0 in | Wt 165.0 lb

## 2017-07-03 DIAGNOSIS — H6121 Impacted cerumen, right ear: Secondary | ICD-10-CM | POA: Diagnosis not present

## 2017-07-03 DIAGNOSIS — L299 Pruritus, unspecified: Secondary | ICD-10-CM

## 2017-07-03 NOTE — Progress Notes (Signed)
Subjective:    Patient ID: Denise Rojas, female    DOB: [birth year], 62 y.o.   MRN: [id number]  HPI 62 year old female comes in today complaining of bilateral ear discomfort.  She bought some over-the-counter Debrox and started using it Friday, Saturday Sunday and Monday.  But after couple days of use she started feeling like she could not hear well out of her right ear.  Now it feels tender and sore.  She says it feels wet on the inside but has not seen any actual drainage.  She feels like things are echoing almost like there is an ocean inside of her ear.  In her left ear she is noticed that it feels itchy deep into the ear but has not had any pain discomfort or drainage from that ear.  No fevers chills or sweats.  She has had some allergy symptoms with a little bit of cough.   Review of Systems     Objective:   Physical Exam  Constitutional: She is oriented to person, place, and time. She appears well-developed and well-nourished.  HENT:  Head: Normocephalic and atraumatic.  Right Ear: External ear normal.  Left Ear: External ear normal.  Nose: Nose normal.  Mouth/Throat: Oropharynx is clear and moist.  Tympanic membrane and canal blocked by cerumen.  Left canal is clear and somatic membrane is normal.  After irrigation of the right canal there was some erythema.  The tympanic membrane was visible and I was able to visualize the ossicle well with a good light reflex.  Eyes: Pupils are equal, round, and reactive to light. Conjunctivae and EOM are normal.  Neck: Neck supple. No thyromegaly present.  Cardiovascular: Normal rate, regular rhythm and normal heart sounds.  Pulmonary/Chest: Effort normal and breath sounds normal. She has no wheezes.  Lymphadenopathy:    She has no cervical adenopathy.  Neurological: She is alert and oriented to person, place, and time.  Skin: Skin is warm and dry.  Psychiatric: She has a normal mood and affect.        Assessment & Plan:  Cerumen  impaction, right ear -commended irrigation so I can get a better look at her tympanic membrane.  He continues to have pain or soreness in the ear of the next couple days please let me know.  She could immediately hear much better and was not hearing the reverberation of noise.  It may be a little sore tender for couple days but if it is getting worse or draining or having any problems and please call us back.  Itchy ears-most consistent with allergies.  She is Artie using a nasal spray as well as Claritin.  Recommend a trial of Allegra and continue with nasal spray.  Indication: Cerumen impaction of the ear(s) Medical necessity statement: On physical examination, cerumen impairs clinically significant portions of the external auditory canal, and tympanic membrane. Noted obstructive, copious cerumen that cannot be removed without magnification and instrumentation. Consent: Discussed benefits and risks of procedure and verbal consent obtained Procedure: Patient was prepped for the procedure. Utilized an otoscope to assess and take note of the ear canal, the tympanic membrane, and the presence, amount, and placement of the cerumen. Gentle water irrigation  was utilized to remove cerumen.  Post procedure examination: shows cerumen was completely removed. Patient tolerated procedure well. The patient is made aware that they may experience temporary vertigo, temporary hearing loss, and temporary discomfort. If these symptom last for more than 24 hours to call the clinic or  proceed to the ED.

## 2017-07-23 ENCOUNTER — Other Ambulatory Visit: Payer: Self-pay | Admitting: Family Medicine

## 2017-09-28 ENCOUNTER — Other Ambulatory Visit: Payer: Self-pay

## 2017-09-28 MED ORDER — DULOXETINE HCL 60 MG PO CPEP
60.0000 mg | ORAL_CAPSULE | Freq: Every day | ORAL | 1 refills | Status: DC
Start: 2017-09-28 — End: 2018-03-18

## 2017-10-15 ENCOUNTER — Other Ambulatory Visit: Payer: Self-pay | Admitting: Family Medicine

## 2017-10-15 DIAGNOSIS — M5412 Radiculopathy, cervical region: Secondary | ICD-10-CM

## 2017-10-15 DIAGNOSIS — M2550 Pain in unspecified joint: Secondary | ICD-10-CM

## 2017-11-20 ENCOUNTER — Encounter: Payer: Self-pay | Admitting: Family Medicine

## 2017-11-20 ENCOUNTER — Ambulatory Visit (INDEPENDENT_AMBULATORY_CARE_PROVIDER_SITE_OTHER): Payer: Self-pay | Admitting: Family Medicine

## 2017-11-20 VITALS — BP 147/86 | HR 67 | Wt 169.0 lb

## 2017-11-20 DIAGNOSIS — R4189 Other symptoms and signs involving cognitive functions and awareness: Secondary | ICD-10-CM

## 2017-11-20 DIAGNOSIS — I10 Essential (primary) hypertension: Secondary | ICD-10-CM

## 2017-11-20 DIAGNOSIS — R519 Headache, unspecified: Secondary | ICD-10-CM

## 2017-11-20 DIAGNOSIS — R51 Headache: Secondary | ICD-10-CM

## 2017-11-20 DIAGNOSIS — G8929 Other chronic pain: Secondary | ICD-10-CM

## 2017-11-20 DIAGNOSIS — M542 Cervicalgia: Secondary | ICD-10-CM

## 2017-11-20 MED ORDER — GABAPENTIN 100 MG PO CAPS: 100.0000 mg | ORAL_CAPSULE | Freq: Three times a day (TID) | ORAL | 3 refills | Status: DC

## 2017-11-20 MED ORDER — LOSARTAN POTASSIUM 25 MG PO TABS
25.0000 mg | ORAL_TABLET | Freq: Every day | ORAL | 1 refills | Status: DC
Start: 2017-11-20 — End: 2018-01-23

## 2017-11-20 MED ORDER — LOSARTAN POTASSIUM 25 MG PO TABS: 25.0000 mg | ORAL_TABLET | Freq: Every day | ORAL | 1 refills | Status: DC

## 2017-11-20 MED ORDER — GABAPENTIN 100 MG PO CAPS: 100.0000 mg | ORAL_CAPSULE | Freq: Three times a day (TID) | ORAL | 3 refills | Status: AC

## 2017-11-20 NOTE — Patient Instructions (Signed)
Keep a daily log of your blood pressure.  Try to check once every other day if you can.  After 2 to 3 weeks on the new medication give us a call back or send a note via my chart to let us know what your prior pressures have been doing so that I can adjust her dose over the phone.

## 2017-11-20 NOTE — Progress Notes (Addendum)
Subjective:    Patient ID: Denise Rojas, female    DOB: [birth year], 62 y.o.   MRN: [id number]  HPI 62 year old female who has a history of hypertension comes in today because of uncontrolled blood pressures.  Is not currently on prescription medication.  She says she noticed it is been high for at least the last month.  Typically running between 1 40-1 60 systolic over 80.  She also complains that she has had daily headaches for almost 7 months.  She is tried taking her husband's gabapentin and says this does help with her headaches. She start taking some iron recently thinking that that might help.  But she is not really sure if she has low iron or not.  She also reports intermittent dizziness particularly with changing position or bending over and then standing up. Has been cong on for months as well.  Has had some ear ringing. She says she almost dropped her grand-daughter one day.    Feels like her mental processing is slow as well for about the last 4 to 5 months.. She was taking care of her granddaughter one day per month and she had one day where she couldn't mix eht formula and even after reading the can she still couldn't figure out how to mix it. She just feels like her memory si getting worse.  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  Ultimately her goal is to be able to help take care of her grandchildren and she feels like right now she is not safe to do so.  Also c/ of her neck pain getting worse.  Mostly at the base of her neck but also radiates down into the trapezius muscles bilaterally.  Her husband lost his job so they have been without health insurance.  He now has a new job but [employer] will kick in until January.  This is been very stressful for the 2 of them as it caused a major financial strain and had to put a lot of expenses on  credit cards etc.  Review of Systems  BP (!) 147/86   Pulse 67   Wt 169 lb (76.7 kg)   BMI 28.12 kg/m     Allergies  Allergen Reactions  . Atorvastatin Other (See Comments)    Myalgias    Past Medical History:  Diagnosis Date  . Allergy   . Depression   . Hyperlipidemia     Past Surgical History:  Procedure Laterality Date  . TOTAL ABDOMINAL HYSTERECTOMY      Social History   Socioeconomic History  . Marital status: Married    Spouse name: Fayrene Fearing  . Number of children: Not on file  . Years of education: Not on file  . Highest education level: Not on file  Occupational History  . Not on file  Social Needs  . Financial resource strain: Not on file  . Food insecurity:    Worry: Not on file    Inability: Not on file  . Transportation needs:    Medical: Not on file    Non-medical: Not on file  Tobacco Use  . Smoking status: Never Smoker  . Smokeless tobacco: Never Used  Substance and Sexual Activity  . Alcohol use: Yes  . Drug use: No  . Sexual activity: Yes  Lifestyle  . Physical activity:  Days per week: Not on file    Minutes per session: Not on file  . Stress: Not on file  Relationships  . Social connections:    Talks on phone: Not on file    Gets together: Not on file    Attends religious service: Not on file    Active member of club or organization: Not on file    Attends meetings of clubs or organizations: Not on file    Relationship status: Not on file  . Intimate partner violence:    Fear of current or ex partner: Not on file    Emotionally abused: Not on file    Physically abused: Not on file    Forced sexual activity: Not on file  Other Topics Concern  . Not on file  Social History Narrative   Some regular exercise. 1 adopted children.     Family History  Problem Relation Age of Onset  . Liver cancer Mother   . Heart attack Unknown   . Stroke Father 79  . Breast cancer Maternal Aunt 89    Outpatient Encounter Medications as  of 11/20/2017  Medication Sig  . Acetaminophen (ARTHRITIS PAIN RELIEF PO) Take by mouth.  . AMBULATORY NON FORMULARY MEDICATION Medication Name: Beet power  . Ascorbic Acid (VITAMIN C) 1000 MG tablet Take 1,000 mg by mouth daily.  Marland Kitchen atorvastatin (LIPITOR) 40 MG tablet Take 1 tablet (40 mg total) by mouth daily.  . Biotin 1 MG CAPS Take by mouth.  . Calcium Carbonate-Vit D-Min (CALCIUM 1200 PO) Take by mouth 2 (two) times daily.  . Cholecalciferol (D3-1000 PO) Take by mouth daily.  . COLLAGEN-VITAMIN C PO Take by mouth.  . Cranberry 500 MG CAPS Take by mouth.  . Cyanocobalamin (VITAMIN B-12) 5000 MCG LOZG Take by mouth.  . Diphenhydramine-APAP, sleep, (ACETAMINOPHEN PM PO) Take by mouth.  . DULoxetine (CYMBALTA) 60 MG capsule Take 1 capsule (60 mg total) by mouth daily.  . fluticasone (FLONASE) 50 MCG/ACT nasal spray Place 1-2 sprays into both nostrils daily.  . Glucosamine-Chondroitin 1500-1200 MG/30ML LIQD Take by mouth.  . Magnesium 400 MG CAPS Take by mouth.  . meloxicam (MOBIC) 15 MG tablet Take 1 tablet by mouth daily  . Omega 3-6-9 Fatty Acids (OMEGA 3-6-9 COMPLEX PO) Take by mouth.  Marland Kitchen POTASSIUM PO Take by mouth.  . gabapentin (NEURONTIN) 100 MG capsule Take 1 capsule (100 mg total) by mouth 3 (three) times daily.  Marland Kitchen losartan (COZAAR) 25 MG tablet Take 1 tablet (25 mg total) by mouth daily.  . [DISCONTINUED] gabapentin (NEURONTIN) 100 MG capsule Take 1 capsule (100 mg total) by mouth 3 (three) times daily.  . [DISCONTINUED] losartan (COZAAR) 25 MG tablet Take 1 tablet (25 mg total) by mouth daily.   No facility-administered encounter medications on file as of 11/20/2017.          Objective:   Physical Exam  Constitutional: She is oriented to person, place, and time. She appears well-developed and well-nourished.  HENT:  Head: Normocephalic and atraumatic.  Right Ear: External ear normal.  Left Ear: External ear normal.  Nose: Nose normal.  Mouth/Throat: Oropharynx is  clear and moist.  TMs and canals are clear.   Eyes: Pupils are equal, round, and reactive to light. Conjunctivae and EOM are normal.  Neck: Neck supple. No thyromegaly present.  Cardiovascular: Normal rate, regular rhythm and normal heart sounds.  Pulmonary/Chest: Effort normal and breath sounds normal. She has no wheezes.  Lymphadenopathy:  She has no cervical adenopathy.  Neurological: She is alert and oriented to person, place, and time. No cranial nerve deficit.  Negative Dix-Hallpike maneuver.  She did feel a little dizzy sitting up but no nystagmus.  And did not experience dizziness while laying back on her back.  Skin: Skin is warm and dry.  Psychiatric: She has a normal mood and affect.       Assessment & Plan:  HTN - Uncontrolled. Discussed starting medication. Discussed starting ACE or ARB. Was on lisinopril previously and says it made her fatigued so had stopped it on her own.  Will try an ARB.  Call if nay problems. She is without insurance so she will call us in about 2-3 weeks and let us now her BPS so we can adjust medication.   I would like to do additional blood work just to rule out anemia thyroid disorder etc.  But she says that she will return in January when she has health insurance to do so.  Chronic daily HA - may be secondary to high BP.  We will see if improves as we tx the BP.  We will also start gabapentin 100mg  up to TID and then can adjust if needed. Consider HA are likely triggering from her neck    Dizziness -certainly could be secondary to elevated blood pressure.  She may also have a little bit of orthostasis since it does seem to happen with position change.  I really want to work to get her blood pressure under better control see note above.  And then see her back.  Right now she does not have health insurance so she wants to just call us in a few weeks and let us know how she is doing as she checks her pressure at home and then hopefully she should have  insurance by January she plans to come back and then and we can also do a more comprehensive work-up with blood work.  Cognitive impairment -I would certainly like to investigate this further.  I do think based on her age she would benefit from probably some neuropsychiatric evaluation for premature memory impairment.  Again we can address this when she comes back in in January may be if we get her blood pressure under better control she will feel better in general which will improve her headaches and her dizziness and may be she will feel more clear and focused.  But certainly if not we will move forward with work-up as she is only 62  Chronic neck pain-again would like to do further work-up when she has health insurance.  She is happy to wait until January to have cost and covered.

## 2017-11-21 ENCOUNTER — Telehealth: Payer: Self-pay

## 2017-11-21 NOTE — Telephone Encounter (Signed)
Pt wanting to know if it would be best for her to take her BP meds at night since a possible side effect is dizziness and she already struggles with dizziness?  Please advise

## 2017-11-22 ENCOUNTER — Encounter: Payer: Self-pay | Admitting: Family Medicine

## 2017-11-22 DIAGNOSIS — R519 Headache, unspecified: Secondary | ICD-10-CM | POA: Insufficient documentation

## 2017-11-22 DIAGNOSIS — R4189 Other symptoms and signs involving cognitive functions and awareness: Secondary | ICD-10-CM | POA: Insufficient documentation

## 2017-11-22 DIAGNOSIS — G8929 Other chronic pain: Secondary | ICD-10-CM | POA: Insufficient documentation

## 2017-11-22 DIAGNOSIS — R51 Headache: Secondary | ICD-10-CM

## 2017-11-22 DIAGNOSIS — M542 Cervicalgia: Secondary | ICD-10-CM

## 2017-11-22 NOTE — Telephone Encounter (Signed)
Left detailed VM with recommendation.  

## 2017-11-22 NOTE — Telephone Encounter (Signed)
Patient advised of recommendations.  

## 2017-11-22 NOTE — Telephone Encounter (Signed)
Start with nighttime and see how she does.  But eventually I would probably want to move it to the morning.

## 2017-11-28 ENCOUNTER — Ambulatory Visit: Payer: BLUE CROSS/BLUE SHIELD | Admitting: Family Medicine

## 2018-01-08 ENCOUNTER — Other Ambulatory Visit: Payer: Self-pay | Admitting: Family Medicine

## 2018-01-08 DIAGNOSIS — M5412 Radiculopathy, cervical region: Secondary | ICD-10-CM

## 2018-01-08 DIAGNOSIS — M2550 Pain in unspecified joint: Secondary | ICD-10-CM

## 2018-01-09 ENCOUNTER — Other Ambulatory Visit: Payer: Self-pay | Admitting: Family Medicine

## 2018-01-13 ENCOUNTER — Other Ambulatory Visit: Payer: Self-pay | Admitting: Family Medicine

## 2018-01-13 DIAGNOSIS — I10 Essential (primary) hypertension: Secondary | ICD-10-CM

## 2018-01-20 ENCOUNTER — Other Ambulatory Visit: Payer: Self-pay | Admitting: Family Medicine

## 2018-01-21 ENCOUNTER — Ambulatory Visit: Payer: Self-pay | Admitting: Family Medicine

## 2018-01-23 ENCOUNTER — Other Ambulatory Visit: Payer: Self-pay

## 2018-01-23 DIAGNOSIS — I10 Essential (primary) hypertension: Secondary | ICD-10-CM

## 2018-01-23 MED ORDER — LOSARTAN POTASSIUM 25 MG PO TABS: 25.0000 mg | ORAL_TABLET | Freq: Every day | ORAL | 1 refills | Status: AC

## 2018-03-18 ENCOUNTER — Other Ambulatory Visit: Payer: Self-pay | Admitting: Family Medicine

## 2018-04-06 ENCOUNTER — Other Ambulatory Visit: Payer: Self-pay | Admitting: Family Medicine

## 2018-04-08 ENCOUNTER — Other Ambulatory Visit: Payer: Self-pay | Admitting: Family Medicine

## 2018-04-08 DIAGNOSIS — M5412 Radiculopathy, cervical region: Secondary | ICD-10-CM

## 2018-04-08 DIAGNOSIS — M2550 Pain in unspecified joint: Secondary | ICD-10-CM

## 2018-05-12 ENCOUNTER — Other Ambulatory Visit: Payer: Self-pay | Admitting: Family Medicine

## 2018-06-02 ENCOUNTER — Other Ambulatory Visit: Payer: Self-pay | Admitting: Family Medicine

## 2018-06-02 DIAGNOSIS — I10 Essential (primary) hypertension: Secondary | ICD-10-CM

## 2018-07-02 ENCOUNTER — Other Ambulatory Visit: Payer: Self-pay | Admitting: Family Medicine

## 2018-07-09 ENCOUNTER — Other Ambulatory Visit: Payer: Self-pay | Admitting: Family Medicine

## 2018-07-11 ENCOUNTER — Other Ambulatory Visit: Payer: Self-pay | Admitting: Family Medicine

## 2018-10-07 ENCOUNTER — Other Ambulatory Visit: Payer: Self-pay | Admitting: Family Medicine

## 2018-10-07 DIAGNOSIS — G8929 Other chronic pain: Secondary | ICD-10-CM

## 2018-10-07 DIAGNOSIS — R519 Headache, unspecified: Secondary | ICD-10-CM
